# Patient Record
Sex: Female | Born: 1970 | Race: White | Hispanic: No | Marital: Single | Smoking: Former smoker
Health system: Southern US, Community
[De-identification: ages and names within clinical notes are randomized; demographics above are authoritative.]

## PROBLEM LIST (undated history)

## (undated) ENCOUNTER — Ambulatory Visit (HOSPITAL_COMMUNITY): Disposition: A | Discharge status: Other Acute Inpatient Hospital | Payer: BC Managed Care – PPO

## (undated) ENCOUNTER — Emergency Department (HOSPITAL_BASED_OUTPATIENT_CLINIC_OR_DEPARTMENT_OTHER): Admission: EM | Payer: BC Managed Care – PPO | Source: Home / Self Care

## (undated) DIAGNOSIS — Z8371 Family history of colonic polyps: Secondary | ICD-10-CM

## (undated) DIAGNOSIS — J45909 Unspecified asthma, uncomplicated: Secondary | ICD-10-CM

## (undated) DIAGNOSIS — M5020 Other cervical disc displacement, unspecified cervical region: Secondary | ICD-10-CM

## (undated) DIAGNOSIS — F418 Other specified anxiety disorders: Secondary | ICD-10-CM

## (undated) DIAGNOSIS — Z83719 Family history of colon polyps, unspecified: Secondary | ICD-10-CM

## (undated) DIAGNOSIS — N920 Excessive and frequent menstruation with regular cycle: Secondary | ICD-10-CM

## (undated) DIAGNOSIS — R87619 Unspecified abnormal cytological findings in specimens from cervix uteri: Secondary | ICD-10-CM

## (undated) DIAGNOSIS — J449 Chronic obstructive pulmonary disease, unspecified: Secondary | ICD-10-CM

## (undated) HISTORY — DX: Unspecified abnormal cytological findings in specimens from cervix uteri: R87.619

## (undated) HISTORY — DX: Family history of colonic polyps: Z83.71

## (undated) HISTORY — PX: COLPOSCOPY: SHX161

## (undated) HISTORY — DX: Other specified anxiety disorders: F41.8

## (undated) HISTORY — DX: Chronic obstructive pulmonary disease, unspecified: J44.9

## (undated) HISTORY — DX: Unspecified asthma, uncomplicated: J45.909

## (undated) HISTORY — DX: Excessive and frequent menstruation with regular cycle: N92.0

## (undated) HISTORY — DX: Other cervical disc displacement, unspecified cervical region: M50.20

## (undated) HISTORY — PX: GYNECOLOGIC CRYOSURGERY: SHX857

## (undated) HISTORY — DX: Family history of colon polyps, unspecified: Z83.719

## (undated) HISTORY — PX: MOLE REMOVAL: SHX2046

---

## 2000-07-22 ENCOUNTER — Emergency Department (HOSPITAL_COMMUNITY): Admission: EM | Admit: 2000-07-22 | Discharge: 2000-07-22 | Payer: Self-pay

## 2000-07-22 ENCOUNTER — Encounter: Payer: Self-pay | Admitting: Emergency Medicine

## 2002-08-13 ENCOUNTER — Other Ambulatory Visit: Admission: RE | Admit: 2002-08-13 | Discharge: 2002-08-13 | Payer: Self-pay | Admitting: Obstetrics and Gynecology

## 2008-08-14 ENCOUNTER — Encounter: Admission: RE | Admit: 2008-08-14 | Discharge: 2008-08-14 | Payer: Self-pay | Admitting: Family Medicine

## 2009-02-25 DIAGNOSIS — J449 Chronic obstructive pulmonary disease, unspecified: Secondary | ICD-10-CM | POA: Insufficient documentation

## 2009-02-25 DIAGNOSIS — J45909 Unspecified asthma, uncomplicated: Secondary | ICD-10-CM | POA: Insufficient documentation

## 2009-02-25 DIAGNOSIS — J4489 Other specified chronic obstructive pulmonary disease: Secondary | ICD-10-CM | POA: Insufficient documentation

## 2009-02-26 ENCOUNTER — Ambulatory Visit: Payer: Self-pay | Admitting: Pulmonary Disease

## 2009-02-26 ENCOUNTER — Encounter: Payer: Self-pay | Admitting: Pulmonary Disease

## 2009-02-26 DIAGNOSIS — J189 Pneumonia, unspecified organism: Secondary | ICD-10-CM | POA: Insufficient documentation

## 2009-02-26 DIAGNOSIS — F17200 Nicotine dependence, unspecified, uncomplicated: Secondary | ICD-10-CM | POA: Insufficient documentation

## 2009-02-26 DIAGNOSIS — J309 Allergic rhinitis, unspecified: Secondary | ICD-10-CM | POA: Insufficient documentation

## 2010-03-12 DIAGNOSIS — M5020 Other cervical disc displacement, unspecified cervical region: Secondary | ICD-10-CM

## 2010-03-12 HISTORY — DX: Other cervical disc displacement, unspecified cervical region: M50.20

## 2010-03-19 ENCOUNTER — Ambulatory Visit (HOSPITAL_COMMUNITY)
Admission: RE | Admit: 2010-03-19 | Discharge: 2010-03-19 | Payer: Self-pay | Source: Home / Self Care | Admitting: General Practice

## 2010-04-05 ENCOUNTER — Encounter
Admission: RE | Admit: 2010-04-05 | Discharge: 2010-06-09 | Payer: Self-pay | Source: Home / Self Care | Attending: Sports Medicine | Admitting: Sports Medicine

## 2010-05-21 ENCOUNTER — Ambulatory Visit (HOSPITAL_COMMUNITY)
Admission: RE | Admit: 2010-05-21 | Discharge: 2010-05-21 | Payer: Self-pay | Source: Home / Self Care | Attending: Sports Medicine | Admitting: Sports Medicine

## 2011-05-08 ENCOUNTER — Other Ambulatory Visit: Payer: Self-pay | Admitting: Obstetrics and Gynecology

## 2011-05-08 DIAGNOSIS — Z1231 Encounter for screening mammogram for malignant neoplasm of breast: Secondary | ICD-10-CM

## 2011-05-26 ENCOUNTER — Ambulatory Visit
Admission: RE | Admit: 2011-05-26 | Discharge: 2011-05-26 | Disposition: A | Discharge status: Home / Self Care | Payer: BC Managed Care – PPO | Source: Ambulatory Visit | Attending: Obstetrics and Gynecology | Admitting: Obstetrics and Gynecology

## 2011-05-26 DIAGNOSIS — Z1231 Encounter for screening mammogram for malignant neoplasm of breast: Secondary | ICD-10-CM

## 2011-12-12 ENCOUNTER — Ambulatory Visit: Payer: BC Managed Care – PPO | Attending: Sports Medicine

## 2011-12-12 DIAGNOSIS — M545 Low back pain, unspecified: Secondary | ICD-10-CM | POA: Insufficient documentation

## 2011-12-12 DIAGNOSIS — M2569 Stiffness of other specified joint, not elsewhere classified: Secondary | ICD-10-CM | POA: Insufficient documentation

## 2011-12-12 DIAGNOSIS — IMO0001 Reserved for inherently not codable concepts without codable children: Secondary | ICD-10-CM | POA: Insufficient documentation

## 2011-12-12 DIAGNOSIS — M542 Cervicalgia: Secondary | ICD-10-CM | POA: Insufficient documentation

## 2011-12-12 DIAGNOSIS — R5381 Other malaise: Secondary | ICD-10-CM | POA: Insufficient documentation

## 2011-12-13 ENCOUNTER — Ambulatory Visit: Payer: BC Managed Care – PPO | Admitting: Physical Therapy

## 2011-12-19 ENCOUNTER — Ambulatory Visit: Payer: BC Managed Care – PPO

## 2011-12-22 ENCOUNTER — Ambulatory Visit: Payer: BC Managed Care – PPO

## 2011-12-25 ENCOUNTER — Ambulatory Visit: Payer: BC Managed Care – PPO | Admitting: Physical Therapy

## 2011-12-29 ENCOUNTER — Ambulatory Visit: Payer: BC Managed Care – PPO

## 2012-01-05 ENCOUNTER — Ambulatory Visit: Payer: BC Managed Care – PPO

## 2012-01-09 ENCOUNTER — Ambulatory Visit: Payer: BC Managed Care – PPO | Admitting: Physical Therapy

## 2012-01-12 ENCOUNTER — Ambulatory Visit: Payer: BC Managed Care – PPO | Attending: Sports Medicine | Admitting: Physical Therapy

## 2012-01-12 DIAGNOSIS — M545 Low back pain, unspecified: Secondary | ICD-10-CM | POA: Insufficient documentation

## 2012-01-12 DIAGNOSIS — R5381 Other malaise: Secondary | ICD-10-CM | POA: Insufficient documentation

## 2012-01-12 DIAGNOSIS — M542 Cervicalgia: Secondary | ICD-10-CM | POA: Insufficient documentation

## 2012-01-12 DIAGNOSIS — IMO0001 Reserved for inherently not codable concepts without codable children: Secondary | ICD-10-CM | POA: Insufficient documentation

## 2012-01-12 DIAGNOSIS — M2569 Stiffness of other specified joint, not elsewhere classified: Secondary | ICD-10-CM | POA: Insufficient documentation

## 2012-04-15 ENCOUNTER — Other Ambulatory Visit: Payer: Self-pay | Admitting: Obstetrics and Gynecology

## 2012-04-15 ENCOUNTER — Other Ambulatory Visit: Payer: Self-pay | Admitting: Nurse Practitioner

## 2012-04-15 DIAGNOSIS — Z1231 Encounter for screening mammogram for malignant neoplasm of breast: Secondary | ICD-10-CM

## 2012-05-27 ENCOUNTER — Ambulatory Visit
Admission: RE | Admit: 2012-05-27 | Discharge: 2012-05-27 | Disposition: A | Discharge status: Home / Self Care | Payer: BC Managed Care – PPO | Source: Ambulatory Visit | Attending: Nurse Practitioner | Admitting: Nurse Practitioner

## 2012-05-27 DIAGNOSIS — Z1231 Encounter for screening mammogram for malignant neoplasm of breast: Secondary | ICD-10-CM

## 2013-03-28 ENCOUNTER — Other Ambulatory Visit: Payer: Self-pay | Admitting: Nurse Practitioner

## 2013-03-28 NOTE — Telephone Encounter (Signed)
Rx was given 04/06/11 for Clindamycin 1% pledgets use as directed 1-2 x day #60 x 2 refills. Please advise

## 2013-04-10 ENCOUNTER — Encounter: Payer: Self-pay | Admitting: Obstetrics and Gynecology

## 2013-04-10 ENCOUNTER — Encounter: Payer: Self-pay | Admitting: Nurse Practitioner

## 2013-04-10 ENCOUNTER — Ambulatory Visit (INDEPENDENT_AMBULATORY_CARE_PROVIDER_SITE_OTHER): Payer: BC Managed Care – PPO | Admitting: Nurse Practitioner

## 2013-04-10 VITALS — BP 100/60 | HR 80 | Resp 16 | Ht 65.75 in | Wt 128.0 lb

## 2013-04-10 DIAGNOSIS — Z01419 Encounter for gynecological examination (general) (routine) without abnormal findings: Secondary | ICD-10-CM

## 2013-04-10 DIAGNOSIS — Z113 Encounter for screening for infections with a predominantly sexual mode of transmission: Secondary | ICD-10-CM

## 2013-04-10 DIAGNOSIS — Z0000 Encounter for general adult medical examination without abnormal findings: Secondary | ICD-10-CM

## 2013-04-10 LAB — POCT URINALYSIS DIPSTICK
Blood, UA: NEGATIVE
Ketones, UA: NEGATIVE
Protein, UA: NEGATIVE
Urobilinogen, UA: NEGATIVE
pH, UA: 6.5

## 2013-04-10 LAB — HEMOGLOBIN, FINGERSTICK: Hemoglobin, fingerstick: 14.2 g/dL (ref 12.0–16.0)
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
## 2013-04-10 NOTE — Progress Notes (Signed)
Encounter reviewed by Dr. Brook Silva.  

## 2013-04-10 NOTE — Patient Instructions (Signed)

## 2013-04-11 LAB — STD PANEL

## 2013-06-13 ENCOUNTER — Other Ambulatory Visit: Payer: Self-pay

## 2013-06-13 DIAGNOSIS — Z1231 Encounter for screening mammogram for malignant neoplasm of breast: Secondary | ICD-10-CM

## 2013-06-26 ENCOUNTER — Other Ambulatory Visit: Payer: Self-pay | Admitting: Family Medicine

## 2013-06-26 DIAGNOSIS — R1011 Right upper quadrant pain: Secondary | ICD-10-CM

## 2013-06-30 ENCOUNTER — Ambulatory Visit
Admission: RE | Admit: 2013-06-30 | Discharge: 2013-06-30 | Disposition: A | Discharge status: Home / Self Care | Payer: BC Managed Care – PPO | Source: Ambulatory Visit | Attending: Family Medicine | Admitting: Family Medicine

## 2013-06-30 DIAGNOSIS — R1011 Right upper quadrant pain: Secondary | ICD-10-CM

## 2013-07-03 ENCOUNTER — Ambulatory Visit
Admission: RE | Admit: 2013-07-03 | Discharge: 2013-07-03 | Disposition: A | Discharge status: Home / Self Care | Payer: BC Managed Care – PPO | Source: Ambulatory Visit

## 2013-07-03 DIAGNOSIS — Z1231 Encounter for screening mammogram for malignant neoplasm of breast: Secondary | ICD-10-CM

## 2014-02-19 ENCOUNTER — Encounter: Payer: Self-pay | Admitting: Nurse Practitioner

## 2014-04-13 ENCOUNTER — Ambulatory Visit
Admission: RE | Admit: 2014-04-13 | Discharge: 2014-04-13 | Disposition: A | Discharge status: Home / Self Care | Payer: BC Managed Care – PPO | Source: Ambulatory Visit | Attending: Allergy | Admitting: Allergy

## 2014-04-13 ENCOUNTER — Other Ambulatory Visit: Payer: Self-pay | Admitting: Allergy

## 2014-04-13 DIAGNOSIS — J4530 Mild persistent asthma, uncomplicated: Secondary | ICD-10-CM

## 2014-04-16 ENCOUNTER — Ambulatory Visit: Payer: BC Managed Care – PPO | Admitting: Nurse Practitioner

## 2014-04-30 ENCOUNTER — Ambulatory Visit: Payer: BC Managed Care – PPO | Admitting: Nurse Practitioner

## 2014-06-18 ENCOUNTER — Encounter: Payer: Self-pay | Admitting: Nurse Practitioner

## 2014-06-18 ENCOUNTER — Ambulatory Visit (INDEPENDENT_AMBULATORY_CARE_PROVIDER_SITE_OTHER): Payer: BC Managed Care – PPO | Admitting: Nurse Practitioner

## 2014-06-18 VITALS — BP 122/76 | HR 84 | Ht 66.0 in | Wt 137.0 lb

## 2014-06-18 DIAGNOSIS — N939 Abnormal uterine and vaginal bleeding, unspecified: Secondary | ICD-10-CM

## 2014-06-18 DIAGNOSIS — Z01419 Encounter for gynecological examination (general) (routine) without abnormal findings: Secondary | ICD-10-CM

## 2014-06-18 DIAGNOSIS — N921 Excessive and frequent menstruation with irregular cycle: Secondary | ICD-10-CM

## 2014-06-18 DIAGNOSIS — Z0000 Encounter for general adult medical examination without abnormal findings: Secondary | ICD-10-CM

## 2014-06-18 LAB — POCT URINALYSIS DIPSTICK
Bilirubin, UA: NEGATIVE
Blood, UA: NEGATIVE
Glucose, UA: NEGATIVE
Ketones, UA: NEGATIVE
Leukocytes, UA: NEGATIVE
NITRITE UA: NEGATIVE
Protein, UA: NEGATIVE
UROBILINOGEN UA: NEGATIVE
pH, UA: 5

## 2014-06-18 LAB — POCT URINE PREGNANCY: PREG TEST UR: NEGATIVE

## 2014-06-18 NOTE — Patient Instructions (Signed)
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
## 2014-06-19 LAB — HEMOGLOBIN, FINGERSTICK: HEMOGLOBIN, FINGERSTICK: 13.5 g/dL (ref 12.0–16.0)

## 2014-06-21 NOTE — Progress Notes (Signed)
Encounter reviewed by Dr. Conley SimmondsBrook Silva.  If metrorrhagia persists, I recommend TSH, sonohysterogram, and possible EMB.

## 2014-06-22 LAB — IPS PAP TEST WITH HPV

## 2014-06-26 ENCOUNTER — Telehealth: Payer: Self-pay | Admitting: Emergency Medicine

## 2014-06-26 DIAGNOSIS — R87610 Atypical squamous cells of undetermined significance on cytologic smear of cervix (ASC-US): Secondary | ICD-10-CM

## 2014-06-26 DIAGNOSIS — R87810 Cervical high risk human papillomavirus (HPV) DNA test positive: Principal | ICD-10-CM

## 2014-06-26 NOTE — Telephone Encounter (Signed)
Message left to return call to [facility] at [phone].   Patient using condoms only for birth control.  Colposcopy order pended for patient notification.

## 2014-06-26 NOTE — Telephone Encounter (Signed)
-----   Message from Lauro FranklinPatricia Rolen-Grubb, FNP sent at 06/22/2014  3:01 PM EST ----- Let patient know that pap shows ASCUS with HR HPV.  Will need Colpo Biopsy with Dr. Edward JollySilva.  (Of note when she comes in, have already discussed other options of AUB and maybe further discussion about repeat PUS and biopsy.)

## 2014-06-26 NOTE — Telephone Encounter (Signed)
Katie Rowland notified of message from Katie FranklinPatricia Rolen-Grubb, FNP.  She is agreeable to scheduling colposcopy. Advised of pap smear and HPV testing. Questions answered. Brief description of procedure given to Katie Rowland.    Discussed menses and need to not have any bleeding on day of appointment. Katie Rowland uses condoms only for contraception. Advised Katie Rowland to call back on first day of cycle to schedule procedure within first 12 days of cycle.   Katie Rowland states LMP approximately 06/06/15.   Katie Rowland will call back to schedule with first day of cycle.   Colposcopy ordered for  insurance pre certification.   Routing to provider for final review. Katie Rowland agreeable to disposition. Will close encounter   cc Dr. Burt EkSilva     Sabrina Rowland.

## 2014-06-30 ENCOUNTER — Telehealth: Payer: Self-pay | Admitting: Nurse Practitioner

## 2014-06-30 NOTE — Telephone Encounter (Signed)
Spoke with patient. Patient of Katie Franklinatricia Rolen-Grubb, FNP. Patient states she started her cycle on 06/26/14. States cycle can be 8-9 days long so hesitant to schedule any earlier than 07/06/14. Patient uses condoms only for birth control method.  Patient scheduled for colposcopy with Dr. Hyacinth Rowland for 07/06/14. Instructions given. Motrin 800 mg po x 1 one hour before appointment with food. Make sure to eat a meal before appointment and drink plenty of fluids. Patient verbalized understanding and will call to reschedule if will be on menses or has any concerns regarding pregnancy. Advised will need to cancel within 24 hours or will have $150.00 late cancellation fee placed to account. Patient agreeable and verbalized understanding of all instructions.   Routing to provider for final review. Patient agreeable to disposition. Will close encounter.

## 2014-06-30 NOTE — Telephone Encounter (Signed)
Patient calling to report she started her menstrual cycle on 06/26/14. She was told to call with this information to schedule a colposcopy.   Patient requests the nurse schedule her appointment and leave the details on her voicemail due to meetings at work. She is aware she will have to adjust her schedule at work for an appointment.

## 2014-07-03 ENCOUNTER — Ambulatory Visit: Payer: BC Managed Care – PPO | Admitting: Obstetrics & Gynecology

## 2014-07-06 ENCOUNTER — Encounter: Payer: Self-pay | Admitting: Obstetrics & Gynecology

## 2014-07-06 ENCOUNTER — Ambulatory Visit (INDEPENDENT_AMBULATORY_CARE_PROVIDER_SITE_OTHER): Payer: BC Managed Care – PPO | Admitting: Obstetrics & Gynecology

## 2014-07-06 DIAGNOSIS — R87810 Cervical high risk human papillomavirus (HPV) DNA test positive: Secondary | ICD-10-CM

## 2014-07-06 DIAGNOSIS — R87610 Atypical squamous cells of undetermined significance on cytologic smear of cervix (ASC-US): Secondary | ICD-10-CM

## 2014-07-06 NOTE — Progress Notes (Signed)
Subjective:     Patient ID: Katie PiggMelissa A Poplin, female   DOB: [birth year], 44 y.o.   MRN: [id number]  HPI 44 yo G1A1 SWF here for colposcopy due to ASCUS pap with + HR HPV obtained at [facility] 06/18/14.  Pt has prior normal pap and neg HR HPV form 2013.  Pt also has remote hx of cryo to cervix in her 20's but has not had any issues since then.    Patient has been counseled about results and procedure.  Risks and benefits have bene reviewed including immediate and/or delayed bleeding, infection, cervical scaring from procedure, possibility of needing additional follow up as well as treatment.  rare risks of missing a lesion discussed as well.  All questions answered.  Pt ready to proceed.  Patient's last menstrual period was 06/26/2014.          Sexually active: Yes.    The current method of family planning is faithful condom use.     Review of Systems     Objective:   Physical Exam  Genitourinary:     BP 128/72 mmHg  Pulse 72  Resp 16  Wt 135 lb 9.6 oz (61.508 kg)   General appearance: alert, cooperative and appears stated age Head: Normocephalic, without obvious abnormality, atraumatic Neurologic: Grossly normal  Pelvic: External genitalia:  no lesions              Urethra:  normal appearing urethra with no masses, tenderness or lesions              Bartholins and Skenes: normal                 Vagina: normal appearing vagina with normal color and discharge, no lesions              Cervix: no lesions              Pap taken: No.  Speculum placed.  3% acetic acid applied to cervix for >45 seconds.  Cervix visualized with both 7.5X and 15X magnification.  Green filter also used.  Lugols solution was not used.  Findings:  Small area of AWE at 7-8 o'clock.  Biopsy:  8 o'clock.  ECC:  was performed.  there was some bleeding noted with the ECC that was more than is typically seen.  Monsel's was needed.  Excellent hemostasis was present.  Pt tolerated procedure well and all instruments were  removed.  Findings noted above on picture of cervix.    Assessment:     ASCUS pap with +HR HPV     Plan:     Biopsy and ECC pending.  If CIN 1 or <, pt will have repeat pap and HR HPV 1 year.  Results will be called to pt.

## 2014-07-08 LAB — IPS OTHER TISSUE BIOPSY

## 2014-07-10 ENCOUNTER — Telehealth: Payer: Self-pay | Admitting: Obstetrics and Gynecology

## 2014-07-10 NOTE — Telephone Encounter (Signed)
Dr.Miller, please review and advise results from 07/06/2014.

## 2014-07-10 NOTE — Telephone Encounter (Signed)
Patient calling to see if her results are in °

## 2014-07-14 ENCOUNTER — Telehealth: Payer: Self-pay

## 2014-07-14 NOTE — Telephone Encounter (Signed)
-----   Message from Annamaria BootsMary Suzanne Miller, MD sent at 07/14/2014  7:30 AM EST ----- Pt notified personally of results.  Needs Pap six months with ECC.  Please call and schedule.  06 recall.

## 2014-07-14 NOTE — Telephone Encounter (Signed)
2/2 lmtcb//kn

## 2014-07-14 NOTE — Telephone Encounter (Signed)
Spoke with pt personally.  Triage note was generated to schedule 6 month f/u pap smear with ECC.  Encounter closed.

## 2014-08-04 NOTE — Telephone Encounter (Signed)
Spoke with patient-6 month pap/ECC appointment scheduled for 12/04/14. Patient aware may want to take Ibuprofen prior to procedure. 06 recall entered in epic for 6/16. Will send message to Martie LeeSabrina in case this needs a pre-cert.//kn

## 2014-12-04 ENCOUNTER — Ambulatory Visit (INDEPENDENT_AMBULATORY_CARE_PROVIDER_SITE_OTHER): Payer: BC Managed Care – PPO | Admitting: Obstetrics & Gynecology

## 2014-12-04 ENCOUNTER — Encounter: Payer: Self-pay | Admitting: Obstetrics & Gynecology

## 2014-12-04 VITALS — BP 124/76 | HR 72 | Resp 16 | Ht 66.0 in | Wt 136.0 lb

## 2014-12-04 DIAGNOSIS — IMO0002 Reserved for concepts with insufficient information to code with codable children: Secondary | ICD-10-CM

## 2014-12-04 DIAGNOSIS — N870 Mild cervical dysplasia: Secondary | ICD-10-CM | POA: Diagnosis not present

## 2014-12-04 DIAGNOSIS — R896 Abnormal cytological findings in specimens from other organs, systems and tissues: Secondary | ICD-10-CM

## 2014-12-04 NOTE — Progress Notes (Signed)
Subjective:     Patient ID: Katie Rowland, female   DOB: 1971/05/20, 44 y.o.   MRN: [id number]  HPI 44 yo G1p1 SWF here for repeat Pap smear and ECC due to hx of ASCUS pap with HR HPV obtained 06/28/14.  Pt did have colposcopy with biopsy and ECC on 07/06/14.  Pathology showed: CERVIX, 8:00, BIOPSIES:  -BENIGN ECTOCERVICAL-TYPE SQUAMOUS MUCOSA WITH MILD KOILOCYTOTIC-TYPE ATYPIA, CONSISTENT WITH HPV EFFECT (LSIL).  -NO SQUAMOCOLUMNAR TRANSITIONAL ZONE PRESENT.  (B) ENDOCERVIX, CURETTINGS:  -SINGLE MINUTE FRAGMENT OF ATYPICAL SQUAMOUS EPITHELIUM WITH SUPERIMPOSED ACUTE INFLAMMATION.  -FEW ADDITIONAL MINUTE PORTIONS OF UNREMARKABLE BENIGN ENDOCERVICAL EPITHELIUM.  -IMMUNOSTAIN FOR P16 (PERFORMED ON BLOCK B1 WITH ADEQUATE CONTROL): NONCONTRIBUTORY DUE TO EXHAUSTION OF CELL GROUP OF INTEREST.   Due to the ECC findings, recommended pt having ECC with pap today.    Pt reports death of two family members and her dog in last couple of months.  This has caused a lot of stress and this month her cycle lasted about 12 days.  It started on time.  There was no heavy bleeding but it just dragged on for days.  Pt reports this has happened in the past due to stressors .  She does not want any treatment at this time.  Just wanted to mention it.  Pt reports hx of PUS in past (nothing in EPIC) showing fibroids   Review of Systems  All other systems reviewed and are negative.      Objective:   Physical Exam  Constitutional: She is oriented to person, place, and time.  HENT:  Head: Normocephalic and atraumatic.  Genitourinary: Vagina normal. There is no rash, tenderness, lesion or injury on the right labia. There is no rash, tenderness, lesion or injury on the left labia. Cervix exhibits no motion tenderness, no discharge and no friability. Right adnexum displays no mass and no tenderness. Left adnexum displays no mass and no tenderness.  Pap obtained.  Following this kevorkian curette used to obtain ECC.  Pt  did have some cramping but tolerated procedure well.  Lymphadenopathy:       Right: No inguinal adenopathy present.       Left: No inguinal adenopathy present.  Neurological: She is alert and oriented to person, place, and time.  Skin: Skin is warm and dry.  Psychiatric: She has a normal mood and affect.       Assessment:     ASCUS pap with + HRHPV Inconclusive ECC with colposcopy 1/16 DUB H/O fibroids per her report     Plan:     Pap and ECC obtained.    Pt aware to let me know if have another month with bleeding like this one.  Would check PUS, TSH, and possible endometrial biopsy.

## 2014-12-07 LAB — IPS PAP SMEAR ONLY

## 2014-12-08 LAB — IPS OTHER TISSUE BIOPSY

## 2014-12-10 ENCOUNTER — Telehealth: Payer: Self-pay

## 2014-12-10 NOTE — Telephone Encounter (Signed)
Left message to call Nava Song at [phone]. 

## 2014-12-10 NOTE — Telephone Encounter (Signed)
-----   Message from Jerene BearsMary S Miller, MD sent at 12/09/2014  3:37 PM EDT ----- Inform pap was normal and ECC negative.  Out of current recall.  Needs AEX and reepat pap with HR HPV 6 months.  Should have 08 recall already entered but if not, please place.  Thanks.

## 2014-12-10 NOTE — Telephone Encounter (Signed)
Spoke with patient. Advised of results as seen below from Dr.Miller. Patient is agreeable and verbalizes understanding. 08 recall entered. Is scheduled for aex on 06/22/2015 at 3:30pm with Lauro FranklinPatricia Rolen-Grubb, FNP.  Routing to provider for final review. Patient agreeable to disposition. Will close encounter.

## 2014-12-10 NOTE — Telephone Encounter (Signed)
Return call to Kaitlyn. °

## 2015-05-14 ENCOUNTER — Other Ambulatory Visit: Payer: Self-pay

## 2015-05-14 DIAGNOSIS — Z1231 Encounter for screening mammogram for malignant neoplasm of breast: Secondary | ICD-10-CM

## 2015-06-17 ENCOUNTER — Ambulatory Visit
Admission: RE | Admit: 2015-06-17 | Discharge: 2015-06-17 | Disposition: A | Discharge status: Home / Self Care | Payer: BC Managed Care – PPO | Source: Ambulatory Visit

## 2015-06-17 DIAGNOSIS — Z1231 Encounter for screening mammogram for malignant neoplasm of breast: Secondary | ICD-10-CM

## 2015-06-22 ENCOUNTER — Ambulatory Visit: Payer: BC Managed Care – PPO | Admitting: Nurse Practitioner

## 2015-08-27 ENCOUNTER — Ambulatory Visit (INDEPENDENT_AMBULATORY_CARE_PROVIDER_SITE_OTHER): Payer: BC Managed Care – PPO | Admitting: Nurse Practitioner

## 2015-08-27 ENCOUNTER — Encounter: Payer: Self-pay | Admitting: Nurse Practitioner

## 2015-08-27 VITALS — BP 120/70 | HR 70 | Resp 16 | Ht 65.75 in | Wt 123.0 lb

## 2015-08-27 DIAGNOSIS — N912 Amenorrhea, unspecified: Secondary | ICD-10-CM | POA: Diagnosis not present

## 2015-08-27 DIAGNOSIS — R87612 Low grade squamous intraepithelial lesion on cytologic smear of cervix (LGSIL): Secondary | ICD-10-CM

## 2015-08-27 DIAGNOSIS — Z0000 Encounter for general adult medical examination without abnormal findings: Secondary | ICD-10-CM

## 2015-08-27 DIAGNOSIS — Z01419 Encounter for gynecological examination (general) (routine) without abnormal findings: Secondary | ICD-10-CM

## 2015-08-27 LAB — POCT URINALYSIS DIPSTICK
Bilirubin, UA: NEGATIVE
Blood, UA: NEGATIVE
GLUCOSE UA: NEGATIVE
KETONES UA: NEGATIVE
Leukocytes, UA: NEGATIVE
Nitrite, UA: NEGATIVE
Protein, UA: NEGATIVE
Urobilinogen, UA: NEGATIVE
pH, UA: 5

## 2015-08-27 LAB — POCT URINE PREGNANCY: PREG TEST UR: NEGATIVE

## 2015-08-27 NOTE — Patient Instructions (Signed)
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
## 2015-08-29 NOTE — Progress Notes (Signed)
Encounter reviewed by Dr. Brook Amundson C. Silva.  

## 2015-09-02 LAB — IPS PAP TEST WITH HPV

## 2015-09-03 LAB — HEMOGLOBIN, FINGERSTICK: Hemoglobin, fingerstick: 13.1 g/dL (ref 12.0–16.0)

## 2015-09-06 ENCOUNTER — Telehealth: Payer: Self-pay | Admitting: *Deleted

## 2015-09-06 MED ORDER — METRONIDAZOLE 0.75 % VA GEL: 1.0000 | Freq: Every day | VAGINAL | Status: AC

## 2015-09-06 NOTE — Telephone Encounter (Signed)
-----   Message from Verner Choleborah S Leonard, CNM sent at 09/03/2015 12:49 PM EDT ----- Pap smear negative, no endos, HPVHR not detected 02 BV present, patient will need Metrogel if symptomatic, please advise

## 2015-09-06 NOTE — Addendum Note (Signed)
Addended by: Francee PiccoloPHILLIPS, STEPHANIE C on: 09/06/2015 12:04 PM   Modules accepted: Orders, SmartSet

## 2015-09-06 NOTE — Telephone Encounter (Signed)
I have attempted to contact this patient by phone with the following results: left message to return call to [place] at [phone] on answering machine (mobile per [facility]).  No personal information given.  [phone] (Mobile)

## 2015-09-27 NOTE — Telephone Encounter (Signed)
Pt notified in result note 09/06/15.  Closing encounter.  

## 2016-07-05 ENCOUNTER — Other Ambulatory Visit: Payer: Self-pay | Admitting: Family Medicine

## 2016-07-05 DIAGNOSIS — Z1231 Encounter for screening mammogram for malignant neoplasm of breast: Secondary | ICD-10-CM

## 2016-08-03 ENCOUNTER — Ambulatory Visit
Admission: RE | Admit: 2016-08-03 | Discharge: 2016-08-03 | Disposition: A | Discharge status: Home / Self Care | Payer: BC Managed Care – PPO | Source: Ambulatory Visit | Attending: Family Medicine | Admitting: Family Medicine

## 2016-08-03 DIAGNOSIS — Z1231 Encounter for screening mammogram for malignant neoplasm of breast: Secondary | ICD-10-CM

## 2016-08-29 ENCOUNTER — Encounter: Payer: Self-pay | Admitting: Nurse Practitioner

## 2016-08-29 ENCOUNTER — Ambulatory Visit (INDEPENDENT_AMBULATORY_CARE_PROVIDER_SITE_OTHER): Payer: BC Managed Care – PPO | Admitting: Nurse Practitioner

## 2016-08-29 VITALS — BP 98/60 | HR 68 | Resp 16 | Ht 65.75 in | Wt 124.0 lb

## 2016-08-29 DIAGNOSIS — Z01419 Encounter for gynecological examination (general) (routine) without abnormal findings: Secondary | ICD-10-CM | POA: Diagnosis not present

## 2016-08-29 DIAGNOSIS — Z0000 Encounter for general adult medical examination without abnormal findings: Secondary | ICD-10-CM | POA: Diagnosis not present

## 2016-08-29 DIAGNOSIS — R87612 Low grade squamous intraepithelial lesion on cytologic smear of cervix (LGSIL): Secondary | ICD-10-CM | POA: Diagnosis not present
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
## 2016-08-29 NOTE — Patient Instructions (Signed)

## 2016-08-31 LAB — IPS PAP TEST WITH HPV

## 2016-08-31 NOTE — Progress Notes (Signed)
Encounter reviewed by Dr. Deeann Servidio Amundson C. Silva.  

## 2017-01-10 ENCOUNTER — Telehealth: Payer: Self-pay | Admitting: Obstetrics and Gynecology

## 2017-01-10 NOTE — Telephone Encounter (Signed)
Left patient a message to call back to reschedule a future appointment that was cancelled by the provider. °

## 2017-06-15 DIAGNOSIS — M545 Low back pain, unspecified: Secondary | ICD-10-CM | POA: Insufficient documentation

## 2017-06-15 DIAGNOSIS — M542 Cervicalgia: Secondary | ICD-10-CM | POA: Insufficient documentation

## 2017-07-17 ENCOUNTER — Other Ambulatory Visit: Payer: Self-pay | Admitting: Family Medicine

## 2017-07-17 DIAGNOSIS — Z1231 Encounter for screening mammogram for malignant neoplasm of breast: Secondary | ICD-10-CM

## 2017-08-07 ENCOUNTER — Ambulatory Visit
Admission: RE | Admit: 2017-08-07 | Discharge: 2017-08-07 | Disposition: A | Discharge status: Home / Self Care | Payer: BC Managed Care – PPO | Source: Ambulatory Visit | Attending: Family Medicine | Admitting: Family Medicine

## 2017-08-07 DIAGNOSIS — Z1231 Encounter for screening mammogram for malignant neoplasm of breast: Secondary | ICD-10-CM

## 2017-08-08 ENCOUNTER — Other Ambulatory Visit: Payer: Self-pay | Admitting: Family Medicine

## 2017-08-08 DIAGNOSIS — R928 Other abnormal and inconclusive findings on diagnostic imaging of breast: Secondary | ICD-10-CM

## 2017-08-14 ENCOUNTER — Ambulatory Visit
Admission: RE | Admit: 2017-08-14 | Discharge: 2017-08-14 | Disposition: A | Discharge status: Home / Self Care | Payer: BC Managed Care – PPO | Source: Ambulatory Visit | Attending: Family Medicine | Admitting: Family Medicine

## 2017-08-14 DIAGNOSIS — R928 Other abnormal and inconclusive findings on diagnostic imaging of breast: Secondary | ICD-10-CM

## 2017-08-31 ENCOUNTER — Ambulatory Visit: Payer: BC Managed Care – PPO | Admitting: Nurse Practitioner

## 2017-08-31 DIAGNOSIS — F419 Anxiety disorder, unspecified: Secondary | ICD-10-CM | POA: Insufficient documentation

## 2017-08-31 DIAGNOSIS — F32A Depression, unspecified: Secondary | ICD-10-CM | POA: Insufficient documentation

## 2017-08-31 DIAGNOSIS — J45909 Unspecified asthma, uncomplicated: Secondary | ICD-10-CM | POA: Insufficient documentation

## 2017-08-31 DIAGNOSIS — K635 Polyp of colon: Secondary | ICD-10-CM | POA: Insufficient documentation

## 2017-09-03 ENCOUNTER — Ambulatory Visit: Payer: BC Managed Care – PPO | Admitting: Obstetrics and Gynecology

## 2017-09-27 ENCOUNTER — Other Ambulatory Visit: Payer: Self-pay

## 2017-09-27 ENCOUNTER — Ambulatory Visit: Payer: BC Managed Care – PPO | Admitting: Obstetrics and Gynecology

## 2017-09-27 ENCOUNTER — Other Ambulatory Visit (HOSPITAL_COMMUNITY)
Admission: RE | Admit: 2017-09-27 | Discharge: 2017-09-27 | Disposition: A | Discharge status: Home / Self Care | Payer: BC Managed Care – PPO | Source: Ambulatory Visit | Attending: Obstetrics and Gynecology | Admitting: Obstetrics and Gynecology

## 2017-09-27 ENCOUNTER — Encounter: Payer: Self-pay | Admitting: Obstetrics and Gynecology

## 2017-09-27 VITALS — BP 102/60 | HR 72 | Resp 14 | Ht 66.0 in | Wt 129.0 lb

## 2017-09-27 DIAGNOSIS — Z124 Encounter for screening for malignant neoplasm of cervix: Secondary | ICD-10-CM | POA: Insufficient documentation

## 2017-09-27 DIAGNOSIS — N920 Excessive and frequent menstruation with regular cycle: Secondary | ICD-10-CM

## 2017-09-27 DIAGNOSIS — Z01419 Encounter for gynecological examination (general) (routine) without abnormal findings: Secondary | ICD-10-CM
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
## 2017-09-27 NOTE — Patient Instructions (Signed)
EXERCISE AND DIET:  We recommended that you start or continue a regular exercise program for good health. Regular exercise means any activity that makes your heart beat faster and makes you sweat.  We recommend exercising at least 30 minutes per day at least 3 days a week, preferably 4 or 5.  We also recommend a diet low in fat and sugar.  Inactivity, poor dietary choices and obesity can cause diabetes, heart attack, stroke, and kidney damage, among others.    ALCOHOL AND SMOKING:  Women should limit their alcohol intake to no more than 7 drinks/beers/glasses of wine (combined, not each!) per week. Moderation of alcohol intake to this level decreases your risk of breast cancer and liver damage. And of course, no recreational drugs are part of a healthy lifestyle.  And absolutely no smoking or even second hand smoke. Most people know smoking can cause heart and lung diseases, but did you know it also contributes to weakening of your bones? Aging of your skin?  Yellowing of your teeth and nails?  CALCIUM AND VITAMIN D:  Adequate intake of calcium and Vitamin D are recommended.  The recommendations for exact amounts of these supplements seem to change often, but generally speaking 600 mg of calcium (either carbonate or citrate) and 800 units of Vitamin D per day seems prudent. Certain women may benefit from higher intake of Vitamin D.  If you are among these women, your doctor will have told you during your visit.    PAP SMEARS:  Pap smears, to check for cervical cancer or precancers,  have traditionally been done yearly, although recent scientific advances have shown that most women can have pap smears less often.  However, every woman still should have a physical exam from her gynecologist every year. It will include a breast check, inspection of the vulva and vagina to check for abnormal growths or skin changes, a visual exam of the cervix, and then an exam to evaluate the size and shape of the uterus and  ovaries.  And after 48 years of age, a rectal exam is indicated to check for rectal cancers. We will also provide age appropriate advice regarding health maintenance, like when you should have certain vaccines, screening for sexually transmitted diseases, bone density testing, colonoscopy, mammograms, etc.   MAMMOGRAMS:  All women over 40 years old should have a yearly mammogram. Many facilities now offer a "3D" mammogram, which may cost around $50 extra out of pocket. If possible,  we recommend you accept the option to have the 3D mammogram performed.  It both reduces the number of women who will be called back for extra views which then turn out to be normal, and it is better than the routine mammogram at detecting truly abnormal areas.    COLONOSCOPY:  Colonoscopy to screen for colon cancer is recommended for all women at age 50.  We know, you hate the idea of the prep.  We agree, BUT, having colon cancer and not knowing it is worse!!  Colon cancer so often starts as a polyp that can be seen and removed at colonscopy, which can quite literally save your life!  And if your first colonoscopy is normal and you have no family history of colon cancer, most women don't have to have it again for 10 years.  Once every ten years, you can do something that may end up saving your life, right?  We will be happy to help you get it scheduled when you are ready.    Be sure to check your insurance coverage so you understand how much it will cost.  It may be covered as a preventative service at no cost, but you should check your particular policy.      Breast Self-Awareness Breast self-awareness means being familiar with how your breasts look and feel. It involves checking your breasts regularly and reporting any changes to your health care provider. Practicing breast self-awareness is important. A change in your breasts can be a sign of a serious medical problem. Being familiar with how your breasts look and feel allows  you to find any problems early, when treatment is more likely to be successful. All women should practice breast self-awareness, including women who have had breast implants. How to do a breast self-exam One way to learn what is normal for your breasts and whether your breasts are changing is to do a breast self-exam. To do a breast self-exam: Look for Changes  1. Remove all the clothing above your waist. 2. Stand in front of a mirror in a room with good lighting. 3. Put your hands on your hips. 4. Push your hands firmly downward. 5. Compare your breasts in the mirror. Look for differences between them (asymmetry), such as: ? Differences in shape. ? Differences in size. ? Puckers, dips, and bumps in one breast and not the other. 6. Look at each breast for changes in your skin, such as: ? Redness. ? Scaly areas. 7. Look for changes in your nipples, such as: ? Discharge. ? Bleeding. ? Dimpling. ? Redness. ? A change in position. Feel for Changes  Carefully feel your breasts for lumps and changes. It is best to do this while lying on your back on the floor and again while sitting or standing in the shower or tub with soapy water on your skin. Feel each breast in the following way:  Place the arm on the side of the breast you are examining above your head.  Feel your breast with the other hand.  Start in the nipple area and make  inch (2 cm) overlapping circles to feel your breast. Use the pads of your three middle fingers to do this. Apply light pressure, then medium pressure, then firm pressure. The light pressure will allow you to feel the tissue closest to the skin. The medium pressure will allow you to feel the tissue that is a little deeper. The firm pressure will allow you to feel the tissue close to the ribs.  Continue the overlapping circles, moving downward over the breast until you feel your ribs below your breast.  Move one finger-width toward the center of the body.  Continue to use the  inch (2 cm) overlapping circles to feel your breast as you move slowly up toward your collarbone.  Continue the up and down exam using all three pressures until you reach your armpit.  Write Down What You Find  Write down what is normal for each breast and any changes that you find. Keep a written record with breast changes or normal findings for each breast. By writing this information down, you do not need to depend only on memory for size, tenderness, or location. Write down where you are in your menstrual cycle, if you are still menstruating. If you are having trouble noticing differences in your breasts, do not get discouraged. With time you will become more familiar with the variations in your breasts and more comfortable with the exam. How often should I examine my breasts? Examine   your breasts every month. If you are breastfeeding, the best time to examine your breasts is after a feeding or after using a breast pump. If you menstruate, the best time to examine your breasts is 5-7 days after your period is over. During your period, your breasts are lumpier, and it may be more difficult to notice changes. When should I see my health care provider? See your health care provider if you notice:  A change in shape or size of your breasts or nipples.  A change in the skin of your breast or nipples, such as a reddened or scaly area.  Unusual discharge from your nipples.  A lump or thick area that was not there before.  Pain in your breasts.  Anything that concerns you.  This information is not intended to replace advice given to you by your health care provider. Make sure you discuss any questions you have with your health care provider. Document Released: 05/29/2005 Document Revised: 11/04/2015 Document Reviewed: 04/18/2015 Elsevier Interactive Patient Education  2018 Elsevier Inc.  

## 2017-09-28 LAB — CBC
Hematocrit: 44 % (ref 34.0–46.6)
Hemoglobin: 13.6 g/dL (ref 11.1–15.9)
MCH: 29.1 pg (ref 26.6–33.0)
MCHC: 30.9 g/dL — ABNORMAL LOW (ref 31.5–35.7)
MCV: 94 fL (ref 79–97)
PLATELETS: 281 10*3/uL (ref 150–379)
RBC: 4.67 x10E6/uL (ref 3.77–5.28)
RDW: 14.5 % (ref 12.3–15.4)
WBC: 7 10*3/uL (ref 3.4–10.8)

## 2017-09-28 LAB — CYTOLOGY - PAP: DIAGNOSIS: NEGATIVE

## 2017-09-28 LAB — TSH: TSH: 0.56 u[IU]/mL (ref 0.450–4.500)

## 2017-10-01 ENCOUNTER — Telehealth: Payer: Self-pay

## 2017-10-01 ENCOUNTER — Telehealth: Payer: Self-pay | Admitting: Obstetrics and Gynecology

## 2017-10-01 MED ORDER — FLUCONAZOLE 150 MG PO TABS: ORAL_TABLET | ORAL | 0 refills | Status: DC

## 2017-10-01 NOTE — Telephone Encounter (Signed)
Appointment reviewed with Dr Oscar LaJertson. Encounter closed.

## 2017-10-01 NOTE — Telephone Encounter (Signed)
Spoke with patient and notified labs normal, pap normal but it did show some yeast. She did state she was symptomatic and will send Rx for Diflucan 150mg  #2, 0Refills to pharmacy as directed by Dr.Jertson.

## 2017-10-01 NOTE — Telephone Encounter (Signed)
Spoke with patient regarding benefit for a recommended ultrasound with possible endometrial biopsy. Patient understood and agreeable. Patient states she started her cycle on 09/28/17, advised patient will review scheduling options with Triage Nurse and return call to her for scheduling. Patient is agreeable.   Routing to Carmelina DaneJill Hamm, RCharity fundraiser

## 2017-10-01 NOTE — Telephone Encounter (Signed)
-----   Message from Romualdo BolkJill Evelyn Jertson, MD sent at 09/28/2017  2:04 PM EDT ----- Please inform the patient that her pap was negative for dysplasia, but there was yeast noted. If she is symptomatic, treat with diflucan 150 mg x 1, may repeat in 72 hours if still symptomatic. #2, no refills Her blood work was normal 02 recall

## 2017-10-01 NOTE — Telephone Encounter (Signed)
Returned call to patient to review scheduling options, as discussed with Billie RuddySally Yeakley, RN. Patient is scheduled on 10/04/17 at 8:00 AM for an ultrasound with a possible endometrial biopsy with Dr Oscar LaJertson. Patient is aware of the appointment date, arrival time and cancellation policy. Patient had no further questions.   Will close encounter  Forwarding to Dr Oscar LaJertson for final review  cc: Billie RuddySally Yeakley, RN

## 2017-10-04 ENCOUNTER — Ambulatory Visit (INDEPENDENT_AMBULATORY_CARE_PROVIDER_SITE_OTHER): Payer: BC Managed Care – PPO

## 2017-10-04 ENCOUNTER — Ambulatory Visit: Payer: BC Managed Care – PPO | Admitting: Obstetrics and Gynecology

## 2017-10-04 ENCOUNTER — Encounter: Payer: Self-pay | Admitting: Obstetrics and Gynecology

## 2017-10-04 VITALS — BP 116/80 | HR 84 | Resp 16 | Ht 66.0 in | Wt 129.0 lb

## 2017-10-04 DIAGNOSIS — N920 Excessive and frequent menstruation with regular cycle: Secondary | ICD-10-CM | POA: Diagnosis not present

## 2017-10-04 DIAGNOSIS — N921 Excessive and frequent menstruation with irregular cycle: Secondary | ICD-10-CM | POA: Diagnosis not present

## 2017-10-04 DIAGNOSIS — Z01812 Encounter for preprocedural laboratory examination: Secondary | ICD-10-CM | POA: Diagnosis not present

## 2017-10-04 LAB — POCT URINE PREGNANCY: PREG TEST UR: NEGATIVE

## 2017-10-04 NOTE — Patient Instructions (Addendum)
Oral Contraception Use Oral contraceptive pills (OCPs) are medicines taken to prevent pregnancy. OCPs work by preventing the ovaries from releasing eggs. The hormones in OCPs also cause the cervical mucus to thicken, preventing the sperm from entering the uterus. The hormones also cause the uterine lining to become thin, not allowing a fertilized egg to attach to the inside of the uterus. OCPs are highly effective when taken exactly as prescribed. However, OCPs do not prevent sexually transmitted d Oral Contraception Information Oral contraceptive pills (OCPs) are medicines taken to prevent pregnancy. OCPs work by preventing the ovaries from releasing eggs. The hormones in OCPs also cause the cervical mucus to thicken, preventing the sperm from entering the uterus. The hormones also cause the uterine lining to become thin, not allowing a fertilized egg to attach to the inside of the uterus. OCPs are highly effective when taken exactly as prescribed. However, OCPs do not prevent sexually transmitted diseases (STDs). Safe sex practices, such as using condoms along with the pill, can help prevent STDs. Before taking the pill, you may have a physical exam and Pap test. Your health care provider may order blood tests. The health care provider will make sure you are a good candidate for oral contraception. Discuss with your health care provider the possible side effects of the OCP you may be prescribed. When starting an OCP, it can take 2 to 3 months for the body to adjust to the changes in hormone levels in your body. Types of oral contraception  The combination pill-This pill contains estrogen and progestin (synthetic progesterone) hormones. The combination pill comes in 21-day, 28-day, or 91-day packs. Some types of combination pills are meant to be taken continuously (365-day pills). With 21-day packs, you do not take pills for 7 days after the last pill. With 28-day packs, the pill is taken every day. The last  7 pills are without hormones. Certain types of pills have more than 21 hormone-containing pills. With 91-day packs, the first 84 pills contain both hormones, and the last 7 pills contain no hormones or contain estrogen only.  The minipill-This pill contains the progesterone hormone only. The pill is taken every day continuously. It is very important to take the pill at the same time each day. The minipill comes in packs of 28 pills. All 28 pills contain the hormone. Advantages of oral contraceptive pills  Decreases premenstrual symptoms.  Treats menstrual period cramps.  Regulates the menstrual cycle.  Decreases a heavy menstrual flow.  May treatacne, depending on the type of pill.  Treats abnormal uterine bleeding.  Treats polycystic ovarian syndrome.  Treats endometriosis.  Can be used as emergency contraception. Things that can make oral contraceptive pills less effective OCPs can be less effective if:  You forget to take the pill at the same time every day.  You have a stomach or intestinal disease that lessens the absorption of the pill.  You take OCPs with other medicines that make OCPs less effective, such as antibiotics, certain HIV medicines, and some seizure medicines.  You take expired OCPs.  You forget to restart the pill on day 7, when using the packs of 21 pills.  Risks associated with oral contraceptive pills Oral contraceptive pills can sometimes cause side effects, such as:  Headache.  Nausea.  Breast tenderness.  Irregular bleeding or spotting.  Combination pills are also associated with a small increased risk of:  Blood clots.  Heart attack.  Stroke.  This information is not intended to replace advice  given to you by your health care provider. Make sure you discuss any questions you have with your health care provider. Document Released: 08/19/2002 Document Revised: 11/04/2015 Document Reviewed: 11/17/2012 Elsevier Interactive Patient  Education  2018 [employer] eases (STDs). Safe sex practices, such as using condoms along with an OCP, can help prevent STDs. Before taking OCPs, you may have a physical exam and Pap test. Your health care provider may also order blood tests if necessary. Your health care provider will make sure you are a good candidate for oral contraception. Discuss with your health care provider the possible side effects of the OCP you may be prescribed. When starting an OCP, it can take 2 to 3 months for the body to adjust to the changes in hormone levels in your body. How to take oral contraceptive pills Your health care provider may advise you on how to start taking the first cycle of OCPs. Otherwise, you can:  Start on day 1 of your menstrual period. You will not need any backup contraceptive protection with this start time.  Start on the first Sunday after your menstrual period or the day you get your prescription. In these cases, you will need to use backup contraceptive protection for the first week.  Start the pill at any time of your cycle. If you take the pill within 5 days of the start of your period, you are protected against pregnancy right away. In this case, you will not need a backup form of birth control. If you start at any other time of your menstrual cycle, you will need to use another form of birth control for 7 days. If your OCP is the type called a minipill, it will protect you from pregnancy after taking it for 2 days (48 hours).  After you have started taking OCPs:  If you forget to take 1 pill, take it as soon as you remember. Take the next pill at the regular time.  If you miss 2 or more pills, call your health care provider because different pills have different instructions for missed doses. Use backup birth control until your next menstrual period starts.  If you use a 28-day pack that contains inactive pills and you miss 1 of the last 7 pills (pills with no hormones), it will  not matter. Throw away the rest of the non-hormone pills and start a new pill pack.  No matter which day you start the OCP, you will always start a new pack on that same day of the week. Have an extra pack of OCPs and a backup contraceptive method available in case you miss some pills or lose your OCP pack. Follow these instructions at home:  Do not smoke.  Always use a condom to protect against STDs. OCPs do not protect against STDs.  Use a calendar to mark your menstrual period days.  Read the information and directions that came with your OCP. Talk to your health care provider if you have questions. Contact a health care provider if:  You develop nausea and vomiting.  You have abnormal vaginal discharge or bleeding.  You develop a rash.  You miss your menstrual period.  You are losing your hair.  You need treatment for mood swings or depression.  You get dizzy when taking the OCP.  You develop acne from taking the OCP.  You become pregnant. Get help right away if:  You develop chest pain.  You develop shortness of breath.  You have an uncontrolled or  severe headache.  You develop numbness or slurred speech.  You develop visual problems.  You develop pain, redness, and swelling in the legs. This information is not intended to replace advice given to you by your health care provider. Make sure you discuss any questions you have with your health care provider. Document Released: 05/18/2011 Document Revised: 11/04/2015 Document Reviewed: 11/17/2012 Elsevier Interactive Patient Education  2017 Elsevier Inc.   Endometrial Biopsy Post-procedure Instructions . Cramping is common.  You may take Ibuprofen, Aleve, or Tylenol for the cramping.  This should resolve within 24 hours.   . You may have a small amount of spotting.  You should wear a mini pad for the next few days. . You may have intercourse in 24 hours. . You need to call the office if you have any pelvic pain,  fever, heavy bleeding, or foul smelling vaginal discharge. . Shower or bathe as normal . You will be notified within one week of your biopsy results or we will discuss your results at your follow-up appointment if needed.
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
## 2017-10-10 ENCOUNTER — Telehealth: Payer: Self-pay | Admitting: *Deleted

## 2017-10-10 NOTE — Telephone Encounter (Signed)
Spoke with patient and gave results. Patient states that she just wants to wait on deciding on any treatment right now. Advised to call if she changes her mind-eh

## 2017-10-10 NOTE — Telephone Encounter (Signed)
-----   Message from Romualdo Bolk, MD sent at 10/09/2017  5:32 PM EDT ----- Please inform the patient that her endometrial biopsy was benign and see if she has decided on treatment option (we discussed doing nothing, OCP's, or a mirena IUD). Thanks

## 2017-10-10 NOTE — Telephone Encounter (Signed)
Left message to call regarding results -eh 

## 2017-10-10 NOTE — Telephone Encounter (Signed)
Return call to Elaine. °

## 2018-10-14 ENCOUNTER — Ambulatory Visit: Payer: BC Managed Care – PPO | Admitting: Obstetrics and Gynecology

## 2018-10-31 ENCOUNTER — Other Ambulatory Visit: Payer: Self-pay

## 2018-11-05 ENCOUNTER — Ambulatory Visit: Payer: BC Managed Care – PPO | Admitting: Obstetrics and Gynecology
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
## 2018-12-11 ENCOUNTER — Ambulatory Visit: Payer: BC Managed Care – PPO | Admitting: Obstetrics and Gynecology

## 2018-12-11 ENCOUNTER — Other Ambulatory Visit: Payer: Self-pay | Admitting: Family Medicine

## 2018-12-11 ENCOUNTER — Other Ambulatory Visit: Payer: Self-pay

## 2018-12-11 ENCOUNTER — Encounter: Payer: Self-pay | Admitting: Obstetrics and Gynecology

## 2018-12-11 VITALS — BP 114/68 | HR 72 | Temp 98.1°F | Ht 66.0 in | Wt 138.2 lb

## 2018-12-11 DIAGNOSIS — N3946 Mixed incontinence: Secondary | ICD-10-CM

## 2018-12-11 DIAGNOSIS — Z01419 Encounter for gynecological examination (general) (routine) without abnormal findings: Secondary | ICD-10-CM

## 2018-12-11 DIAGNOSIS — Z1231 Encounter for screening mammogram for malignant neoplasm of breast: Secondary | ICD-10-CM

## 2018-12-11 DIAGNOSIS — N951 Menopausal and female climacteric states: Secondary | ICD-10-CM | POA: Diagnosis not present

## 2018-12-11 NOTE — Patient Instructions (Signed)
EXERCISE AND DIET:  We recommended that you start or continue a regular exercise program for good health. Regular exercise means any activity that makes your heart beat faster and makes you sweat.  We recommend exercising at least 30 minutes per day at least 3 days a week, preferably 4 or 5.  We also recommend a diet low in fat and sugar.  Inactivity, poor dietary choices and obesity can cause diabetes, heart attack, stroke, and kidney damage, among others.    ALCOHOL AND SMOKING:  Women should limit their alcohol intake to no more than 7 drinks/beers/glasses of wine (combined, not each!) per week. Moderation of alcohol intake to this level decreases your risk of breast cancer and liver damage. And of course, no recreational drugs are part of a healthy lifestyle.  And absolutely no smoking or even second hand smoke. Most people know smoking can cause heart and lung diseases, but did you know it also contributes to weakening of your bones? Aging of your skin?  Yellowing of your teeth and nails?  CALCIUM AND VITAMIN D:  Adequate intake of calcium and Vitamin D are recommended.  The recommendations for exact amounts of these supplements seem to change often, but generally speaking 1,000 mg of calcium (between diet and supplement) and 800 units of Vitamin D per day seems prudent. Certain women may benefit from higher intake of Vitamin D.  If you are among these women, your doctor will have told you during your visit.    PAP SMEARS:  Pap smears, to check for cervical cancer or precancers,  have traditionally been done yearly, although recent scientific advances have shown that most women can have pap smears less often.  However, every woman still should have a physical exam from her gynecologist every year. It will include a breast check, inspection of the vulva and vagina to check for abnormal growths or skin changes, a visual exam of the cervix, and then an exam to evaluate the size and shape of the uterus and  ovaries.  And after 49 years of age, a rectal exam is indicated to check for rectal cancers. We will also provide age appropriate advice regarding health maintenance, like when you should have certain vaccines, screening for sexually transmitted diseases, bone density testing, colonoscopy, mammograms, etc.   MAMMOGRAMS:  All women over 49 years old should have a yearly mammogram. Many facilities now offer a "3D" mammogram, which may cost around $50 extra out of pocket. If possible,  we recommend you accept the option to have the 3D mammogram performed.  It both reduces the number of women who will be called back for extra views which then turn out to be normal, and it is better than the routine mammogram at detecting truly abnormal areas.    COLON CANCER SCREENING: Now recommend starting at age 49. At this time colonoscopy is not covered for routine screening until 50. There are take home tests that can be done between 45-49.   COLONOSCOPY:  Colonoscopy to screen for colon cancer is recommended for all women at age 49.  We know, you hate the idea of the prep.  We agree, BUT, having colon cancer and not knowing it is worse!!  Colon cancer so often starts as a polyp that can be seen and removed at colonscopy, which can quite literally save your life!  And if your first colonoscopy is normal and you have no family history of colon cancer, most women don't have to have it again for  10 years.  Once every ten years, you can do something that may end up saving your life, right?  We will be happy to help you get it scheduled when you are ready.  Be sure to check your insurance coverage so you understand how much it will cost.  It may be covered as a preventative service at no cost, but you should check your particular policy.   ° ° ° °Breast Self-Awareness °Breast self-awareness means being familiar with how your breasts look and feel. It involves checking your breasts regularly and reporting any changes to your  health care provider. °Practicing breast self-awareness is important. A change in your breasts can be a sign of a serious medical problem. Being familiar with how your breasts look and feel allows you to find any problems early, when treatment is more likely to be successful. All women should practice breast self-awareness, including women who have had breast implants. °How to do a breast self-exam °One way to learn what is normal for your breasts and whether your breasts are changing is to do a breast self-exam. To do a breast self-exam: °Look for Changes ° °1. Remove all the clothing above your waist. °2. Stand in front of a mirror in a room with good lighting. °3. Put your hands on your hips. °4. Push your hands firmly downward. °5. Compare your breasts in the mirror. Look for differences between them (asymmetry), such as: °? Differences in shape. °? Differences in size. °? Puckers, dips, and bumps in one breast and not the other. °6. Look at each breast for changes in your skin, such as: °? Redness. °? Scaly areas. °7. Look for changes in your nipples, such as: °? Discharge. °? Bleeding. °? Dimpling. °? Redness. °? A change in position. °Feel for Changes °Carefully feel your breasts for lumps and changes. It is best to do this while lying on your back on the floor and again while sitting or standing in the shower or tub with soapy water on your skin. Feel each breast in the following way: °· Place the arm on the side of the breast you are examining above your head. °· Feel your breast with the other hand. °· Start in the nipple area and make ¾ inch (2 cm) overlapping circles to feel your breast. Use the pads of your three middle fingers to do this. Apply light pressure, then medium pressure, then firm pressure. The light pressure will allow you to feel the tissue closest to the skin. The medium pressure will allow you to feel the tissue that is a little deeper. The firm pressure will allow you to feel the tissue  close to the ribs. °· Continue the overlapping circles, moving downward over the breast until you feel your ribs below your breast. °· Move one finger-width toward the center of the body. Continue to use the ¾ inch (2 cm) overlapping circles to feel your breast as you move slowly up toward your collarbone. °· Continue the up and down exam using all three pressures until you reach your armpit. ° °Write Down What You Find ° °Write down what is normal for each breast and any changes that you find. Keep a written record with breast changes or normal findings for each breast. By writing this information down, you do not need to depend only on memory for size, tenderness, or location. Write down where you are in your menstrual cycle, if you are still menstruating. °If you are having trouble noticing differences   in your breasts, do not get discouraged. With time you will become more familiar with the variations in your breasts and more comfortable with the exam. °How often should I examine my breasts? °Examine your breasts every month. If you are breastfeeding, the best time to examine your breasts is after a feeding or after using a breast pump. If you menstruate, the best time to examine your breasts is 5-7 days after your period is over. During your period, your breasts are lumpier, and it may be more difficult to notice changes. °When should I see my health care provider? °See your health care provider if you notice: °· A change in shape or size of your breasts or nipples. °· A change in the skin of your breast or nipples, such as a reddened or scaly area. °· Unusual discharge from your nipples. °· A lump or thick area that was not there before. °· Pain in your breasts. °· Anything that concerns you. ° °Urinary Incontinence ° °Urinary incontinence refers to a condition in which a person is unable to control where and when to pass urine. A person with this condition will urinate when he or she does not mean to  (involuntarily). °What are the causes? °This condition may be caused by: °· Medicines. °· Infections. °· Constipation. °· Overactive bladder muscles. °· Weak bladder muscles. °· Weak pelvic floor muscles. These muscles provide support for the bladder, intestine, and, in women, the uterus. °· Enlarged prostate in men. The prostate is a gland near the bladder. When it gets too big, it can pinch the urethra. With the urethra blocked, the bladder can weaken and lose the ability to empty properly. °· Surgery. °· Emotional factors, such as anxiety, stress, or post-traumatic stress disorder (PTSD). °· Pelvic organ prolapse. This happens in women when organs shift out of place and into the vagina. This shift can prevent the bladder and urethra from working properly. °What increases the risk? °The following factors may make you more likely to develop this condition: °· Older age. °· Obesity and physical inactivity. °· Pregnancy and childbirth. °· Menopause. °· Diseases that affect the nerves or spinal cord (neurological diseases). °· Long-term (chronic) coughing. This can increase pressure on the bladder and pelvic floor muscles. °What are the signs or symptoms? °Symptoms may vary depending on the type of urinary incontinence you have. They include: °· A sudden urge to urinate, but passing urine involuntarily before you can get to a bathroom (urge incontinence). °· Suddenly passing urine with any activity that forces urine to pass, such as coughing, laughing, exercise, or sneezing (stress incontinence). °· Needing to urinate often, but urinating only a small amount, or constantly dribbling urine (overflow incontinence). °· Urinating because you cannot get to the bathroom in time due to a physical disability, such as arthritis or injury, or communication and thinking problems, such as Alzheimer disease (functional incontinence). °How is this diagnosed? °This condition may be diagnosed based on: °· Your medical history. °· A  physical exam. °· Tests, such as: °? Urine tests. °? X-rays of your kidney and bladder. °? Ultrasound. °? CT scan. °? Cystoscopy. In this procedure, a health care provider inserts a tube with a light and camera (cystoscope) through the urethra and into the bladder in order to check for problems. °? Urodynamic testing. These tests assess how well the bladder, urethra, and sphincter can store and release urine. There are different types of urodynamic tests, and they vary depending on what the test is measuring. °  To help diagnose your condition, your health care provider may recommend that you keep a log of when you urinate and how much you urinate. °How is this treated? °Treatment for this condition depends on the type of incontinence that you have and its cause. Treatment may include: °· Lifestyle changes, such as: °? Quitting smoking. °? Maintaining a healthy weight. °? Staying active. Try to get 150 minutes of moderate-intensity exercise every week. Ask your health care provider which activities are safe for you. °? Eating a healthy diet. °§ Avoid high-fat foods, like fried foods. °§ Avoid refined carbohydrates like white bread and white rice. °§ Limit how much alcohol and caffeine you drink. °§ Increase your fiber intake. Foods such as fresh fruits, vegetables, beans, and whole grains are healthy sources of fiber. °· Pelvic floor muscle exercises. °· Bladder training, such as lengthening the amount of time between bathroom breaks, or using the bathroom at regular intervals. °· Using techniques to suppress bladder urges. This can include distraction techniques or controlled breathing exercises. °· Medicines to relax the bladder muscles and prevent bladder spasms. °· Medicines to help slow or prevent the growth of a man's prostate. °· Botox injections. These can help relax the bladder muscles. °· Using pulses of electricity to help change bladder reflexes (electrical nerve stimulation). °· For women, using a medical  device to prevent urine leaks. This is a small, tampon-like, disposable device that is inserted into the urethra. °· Injecting collagen or carbon beads (bulking agents) into the urinary sphincter. These can help thicken tissue and close the bladder opening. °· Surgery. °Follow these instructions at home: °Lifestyle °· Limit alcohol and caffeine. These can fill your bladder quickly and irritate it. °· Keep yourself clean to help prevent odors and skin damage. Ask your doctor about special skin creams and cleansers that can protect the skin from urine. °· Consider wearing pads or adult diapers. Make sure to change them regularly, and always change them right after experiencing incontinence. °General instructions °· Take over-the-counter and prescription medicines only as told by your health care provider. °· Use the bathroom about every 3-4 hours, even if you do not feel the need to urinate. Try to empty your bladder completely every time. After urinating, wait a minute. Then try to urinate again. °· Make sure you are in a relaxed position while urinating. °· If your incontinence is caused by nerve problems, keep a log of the medicines you take and the times you go to the bathroom. °· Keep all follow-up visits as told by your health care provider. This is important. °Contact a health care provider if: °· You have pain that gets worse. °· Your incontinence gets worse. °Get help right away if: °· You have a fever or chills. °· You are unable to urinate. °· You have redness in your groin area or down your legs. °Summary °· Urinary incontinence refers to a condition in which a person is unable to control where and when to pass urine. °· This condition may be caused by medicines, infection, weak bladder muscles, weak pelvic floor muscles, enlargement of the prostate (in men), or surgery. °· The following factors increase your risk for developing this condition: older age, obesity, pregnancy and childbirth, menopause,  neurological diseases, and chronic coughing. °· There are several types of urinary incontinence. They include urge incontinence, stress incontinence, overflow incontinence, and functional incontinence. °· This condition is usually treated first with lifestyle and behavioral changes, such as quitting smoking, eating a healthier   healthier diet, and doing regular pelvic floor exercises. Other treatment options include medicines, bulking agents, medical devices, electrical nerve stimulation, or surgery. °This information is not intended to replace advice given to you by your health care provider. Make sure you discuss any questions you have with your health care provider. °Document Released: 07/06/2004 Document Revised: 06/08/2017 Document Reviewed: 09/07/2016 °[employer]vier Patient Education © 2020 [employer] ° °Kegel Exercises ° °Kegel exercises can help strengthen your pelvic floor muscles. The pelvic floor is a group of muscles that support your rectum, small intestine, and bladder. In females, pelvic floor muscles also help support the womb (uterus). These muscles help you control the flow of urine and stool. °Kegel exercises are painless and simple, and they do not require any equipment. Your provider may suggest Kegel exercises to: °· Improve bladder and bowel control. °· Improve sexual response. °· Improve weak pelvic floor muscles after surgery to remove the uterus (hysterectomy) or pregnancy (females). °· Improve weak pelvic floor muscles after prostate gland removal or surgery (males). °Kegel exercises involve squeezing your pelvic floor muscles, which are the same muscles you squeeze when you try to stop the flow of urine or keep from passing gas. The exercises can be done while sitting, standing, or lying down, but it is best to vary your position. °Exercises °How to do Kegel exercises: °1. Squeeze your pelvic floor muscles tight. You should feel a tight lift in your rectal area. If you are a female, you should also  feel a tightness in your vaginal area. Keep your stomach, buttocks, and legs relaxed. °2. Hold the muscles tight for up to 10 seconds. °3. Breathe normally. °4. Relax your muscles. °5. Repeat as told by your health care provider. °Repeat this exercise daily as told by your health care provider. Continue to do this exercise for at least 4-6 weeks, or for as long as told by your health care provider. °You may be referred to a physical therapist who can help you learn more about how to do Kegel exercises. °Depending on your condition, your health care provider may recommend: °· Varying how long you squeeze your muscles. °· Doing several sets of exercises every day. °· Doing exercises for several weeks. °· Making Kegel exercises a part of your regular exercise routine. °This information is not intended to replace advice given to you by your health care provider. Make sure you discuss any questions you have with your health care provider. °Document Released: 05/15/2012 Document Revised: 01/16/2018 Document Reviewed: 01/16/2018 °[employer]vier Patient Education © 2020 [employer] ° °

## 2018-12-12 LAB — URINALYSIS, MICROSCOPIC ONLY
Bacteria, UA: NONE SEEN
Casts: NONE SEEN /lpf
RBC: NONE SEEN /hpf (ref 0–2)
WBC, UA: NONE SEEN /hpf (ref 0–5)

## 2018-12-12 LAB — URINE CULTURE

## 2019-01-24 ENCOUNTER — Ambulatory Visit
Admission: RE | Admit: 2019-01-24 | Discharge: 2019-01-24 | Disposition: A | Discharge status: Home / Self Care | Payer: BC Managed Care – PPO | Source: Ambulatory Visit | Attending: Family Medicine | Admitting: Family Medicine

## 2019-01-24 ENCOUNTER — Other Ambulatory Visit: Payer: Self-pay

## 2019-01-24 DIAGNOSIS — Z1231 Encounter for screening mammogram for malignant neoplasm of breast: Secondary | ICD-10-CM

## 2019-04-02 ENCOUNTER — Other Ambulatory Visit: Payer: Self-pay

## 2019-04-02 ENCOUNTER — Telehealth: Payer: BC Managed Care – PPO | Admitting: Nurse Practitioner

## 2019-04-02 DIAGNOSIS — Z20822 Contact with and (suspected) exposure to covid-19: Secondary | ICD-10-CM

## 2019-04-02 DIAGNOSIS — R05 Cough: Secondary | ICD-10-CM

## 2019-04-02 DIAGNOSIS — R5081 Fever presenting with conditions classified elsewhere: Secondary | ICD-10-CM

## 2019-04-02 DIAGNOSIS — R059 Cough, unspecified: Secondary | ICD-10-CM

## 2019-04-02 DIAGNOSIS — Z20828 Contact with and (suspected) exposure to other viral communicable diseases: Secondary | ICD-10-CM

## 2019-04-02 MED ORDER — BENZONATATE 100 MG PO CAPS: 100.0000 mg | ORAL_CAPSULE | Freq: Three times a day (TID) | ORAL | 0 refills | Status: DC | PRN

## 2019-04-02 NOTE — Progress Notes (Signed)
E-Visit for Corona Virus Screening   Your current symptoms could be consistent with the coronavirus.  Many health care providers can now test patients at their office but not all are.  [facility] has multiple testing sites. For information on our COVID testing locations and hours go to [email or web address]  Please quarantine yourself while awaiting your test results.  We are enrolling you in our MyChart Home Montioring for COVID19 . Daily you will receive a questionnaire within the MyChart website. Our COVID 19 response team willl be monitoriing your responses daily.  You can go to one of the  testing sites listed below, while they are opened (see hours). You do not need a doctors order to be tested for covid.You do need to self-isolate until your results return and if positive 14 days from when your symptoms started and until you are 3 days symptom free.   Testing Locations (Monday - Friday, 8 a.m. - 3:30 p.m.)   [place]:  [facility] (Visitor Entrance), [address], [place], [place] -   [place]: [facility], [address], [place], [place] (entrance off [address]) -   [place] (Closed each Monday): [address], [place], [place] - the short stay covered drive at [facility] (Use the [address] entrance to [facility] next to [facility].) -     COVID-19 is a respiratory illness with symptoms that are similar to the flu. Symptoms are typically mild to moderate, but there have been cases of severe illness and death due to the virus. The following symptoms may appear 2-14 days after exposure: . Fever . Cough . Shortness of breath or difficulty breathing . Chills . Repeated shaking with chills . Muscle pain . Headache . Sore throat . New loss of taste or smell . Fatigue . Congestion or runny nose . Nausea or vomiting . Diarrhea  It is vitally  important that if you feel that you have an infection such as this virus or any other virus that you stay home and away from places where you may spread it to others.  You should self-quarantine for 14 days if you have symptoms that could potentially be coronavirus or have been in close contact a with a person diagnosed with COVID-19 within the last 2 weeks. You should avoid contact with people age 65 and older.   You should wear a mask or cloth face covering over your nose and mouth if you must be around other people or animals, including pets (even at home). Try to stay at least 6 feet away from other people. This will protect the people around you.  You can use medication such as A prescription cough medication called Tessalon Perles 100 mg. You may take 1-2 capsules every 8 hours as needed for cough  You may also take acetaminophen (Tylenol) as needed for fever.   Reduce your risk of any infection by using the same precautions used for avoiding the common cold or flu:  . Wash your hands often with soap and warm water for at least 20 seconds.  If soap and water are not readily available, use an alcohol-based hand sanitizer with at least 60% alcohol.  . If coughing or sneezing, cover your mouth and nose by coughing or sneezing into the elbow areas of your shirt or coat, into a tissue or into your sleeve (not your hands). . Avoid shaking hands with others and consider head nods or   verbal greetings only. . Avoid touching your eyes, nose, or mouth with unwashed hands.  . Avoid close contact with people who are sick. . Avoid places or events with large numbers of people in one location, like concerts or sporting events. . Carefully consider travel plans you have or are making. . If you are planning any travel outside or inside the [place], visit the CDC's Travelers' Health webpage for the latest health notices. . If you have some symptoms but not all symptoms, continue to monitor at home and seek medical  attention if your symptoms worsen. . If you are having a medical emergency, call 911.  HOME CARE . Only take medications as instructed by your medical team. . Drink plenty of fluids and get plenty of rest. . A steam or ultrasonic humidifier can help if you have congestion.   GET HELP RIGHT AWAY IF YOU HAVE EMERGENCY WARNING SIGNS** FOR COVID-19. If you or someone is showing any of these signs seek emergency medical care immediately. Call 911 or proceed to your closest emergency facility if: . You develop worsening high fever. . Trouble breathing . Bluish lips or face . Persistent pain or pressure in the chest . New confusion . Inability to wake or stay awake . You cough up blood. . Your symptoms become more severe  **This list is not all possible symptoms. Contact your medical provider for any symptoms that are sever or concerning to you.   MAKE SURE YOU   Understand these instructions.  Will watch your condition.  Will get help right away if you are not doing well or get worse.  Your e-visit answers were reviewed by a board certified advanced clinical practitioner to complete your personal care plan.  Depending on the condition, your plan could have included both over the counter or prescription medications.  If there is a problem please reply once you have received a response from your provider.  Your safety is important to us.  If you have drug allergies check your prescription carefully.    You can use MyChart to ask questions about today's visit, request a non-urgent call back, or ask for a work or school excuse for 24 hours related to this e-Visit. If it has been greater than 24 hours you will need to follow up with your provider, or enter a new e-Visit to address those concerns. You will get an e-mail in the next two days asking about your experience.  I hope that your e-visit has been valuable and will speed your recovery. Thank you for using e-visits.   5-10 minutes  spent reviewing and documenting in chart.  

## 2019-04-04 LAB — NOVEL CORONAVIRUS, NAA: SARS-CoV-2, NAA: NOT DETECTED
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
## 2019-12-17 ENCOUNTER — Other Ambulatory Visit: Payer: Self-pay

## 2019-12-17 ENCOUNTER — Encounter: Payer: Self-pay | Admitting: Obstetrics and Gynecology

## 2019-12-17 ENCOUNTER — Ambulatory Visit: Payer: BC Managed Care – PPO | Admitting: Obstetrics and Gynecology

## 2019-12-17 VITALS — BP 110/64 | HR 83 | Ht 65.75 in | Wt 134.5 lb

## 2019-12-17 DIAGNOSIS — N951 Menopausal and female climacteric states: Secondary | ICD-10-CM | POA: Diagnosis not present

## 2019-12-17 DIAGNOSIS — Z124 Encounter for screening for malignant neoplasm of cervix: Secondary | ICD-10-CM

## 2019-12-17 DIAGNOSIS — Z01419 Encounter for gynecological examination (general) (routine) without abnormal findings: Secondary | ICD-10-CM

## 2019-12-17 MED ORDER — MEDROXYPROGESTERONE ACETATE 5 MG PO TABS: ORAL_TABLET | ORAL | 1 refills | Status: AC

## 2019-12-17 NOTE — Patient Instructions (Addendum)
Perimenopause  Perimenopause is the normal time of life before and after menstrual periods stop completely (menopause). Perimenopause can begin 2-8 years before menopause, and it usually lasts for 1 year after menopause. During perimenopause, the ovaries may or may not produce an egg. What are the causes? This condition is caused by a natural change in hormone levels that happens as you get older. What increases the risk? This condition is more likely to start at an earlier age if you have certain medical conditions or treatments, including:  A tumor of the pituitary gland in the brain.  A disease that affects the ovaries and hormone production.  Radiation treatment for cancer.  Certain cancer treatments, such as chemotherapy or hormone (anti-estrogen) therapy.  Heavy smoking and excessive alcohol use.  Family history of early menopause. What are the signs or symptoms? Perimenopausal changes affect each woman differently. Symptoms of this condition may include:  Hot flashes.  Night sweats.  Irregular menstrual periods.  Decreased sex drive.  Vaginal dryness.  Headaches.  Mood swings.  Depression.  Memory problems or trouble concentrating.  Irritability.  Tiredness.  Weight gain.  Anxiety.  Trouble getting pregnant. How is this diagnosed? This condition is diagnosed based on your medical history, a physical exam, your age, your menstrual history, and your symptoms. Hormone tests may also be done. How is this treated? In some cases, no treatment is needed. You and your health care provider should make a decision together about whether treatment is necessary. Treatment will be based on your individual condition and preferences. Various treatments are available, such as:  Menopausal hormone therapy (MHT).  Medicines to treat specific symptoms.  Acupuncture.  Vitamin or herbal supplements. Before starting treatment, make sure to let your health care provider  know if you have a personal or family history of:  Heart disease.  Breast cancer.  Blood clots.  Diabetes.  Osteoporosis. Follow these instructions at home: Lifestyle  Do not use any products that contain nicotine or tobacco, such as cigarettes and e-cigarettes. If you need help quitting, ask your health care provider.  Eat a balanced diet that includes fresh fruits and vegetables, whole grains, soybeans, eggs, lean meat, and low-fat dairy.  Get at least 30 minutes of physical activity on 5 or more days each week.  Avoid alcoholic and caffeinated beverages, as well as spicy foods. This may help prevent hot flashes.  Get 7-8 hours of sleep each night.  Dress in layers that can be removed to help you manage hot flashes.  Find ways to manage stress, such as deep breathing, meditation, or journaling. General instructions  Keep track of your menstrual periods, including: ? When they occur. ? How heavy they are and how long they last. ? How much time passes between periods.  Keep track of your symptoms, noting when they start, how often you have them, and how long they last.  Take over-the-counter and prescription medicines only as told by your health care provider.  Take vitamin supplements only as told by your health care provider. These may include calcium, vitamin E, and vitamin D.  Use vaginal lubricants or moisturizers to help with vaginal dryness and improve comfort during sex.  Talk with your health care provider before starting any herbal supplements.  Keep all follow-up visits as told by your health care provider. This is important. This includes any group therapy or counseling. Contact a health care provider if:  You have heavy vaginal bleeding or pass blood clots.  Your period   lasts more than 2 days longer than normal.  Your periods are recurring sooner than 21 days.  You bleed after having sex. Get help right away if:  You have chest pain, trouble  breathing, or trouble talking.  You have severe depression.  You have pain when you urinate.  You have severe headaches.  You have vision problems. Summary  Perimenopause is the time when a woman's body begins to move into menopause. This may happen naturally or as a result of other health problems or medical treatments.  Perimenopause can begin 2-8 years before menopause, and it usually lasts for 1 year after menopause.  Perimenopausal symptoms can be managed through medicines, lifestyle changes, and complementary therapies such as acupuncture. This information is not intended to replace advice given to you by your health care provider. Make sure you discuss any questions you have with your health care provider. Document Revised: 05/11/2017 Document Reviewed: 07/04/2016 Elsevier Patient Education  2020 Elsevier Inc.   EXERCISE AND DIET:  We recommended that you start or continue a regular exercise program for good health. Regular exercise means any activity that makes your heart beat faster and makes you sweat.  We recommend exercising at least 30 minutes per day at least 3 days a week, preferably 4 or 5.  We also recommend a diet low in fat and sugar.  Inactivity, poor dietary choices and obesity can cause diabetes, heart attack, stroke, and kidney damage, among others.    ALCOHOL AND SMOKING:  Women should limit their alcohol intake to no more than 7 drinks/beers/glasses of wine (combined, not each!) per week. Moderation of alcohol intake to this level decreases your risk of breast cancer and liver damage. And of course, no recreational drugs are part of a healthy lifestyle.  And absolutely no smoking or even second hand smoke. Most people know smoking can cause heart and lung diseases, but did you know it also contributes to weakening of your bones? Aging of your skin?  Yellowing of your teeth and nails?  CALCIUM AND VITAMIN D:  Adequate intake of calcium and Vitamin D are recommended.   The recommendations for exact amounts of these supplements seem to change often, but generally speaking 1,000 mg of calcium (between diet and supplement) and 800 units of Vitamin D per day seems prudent. Certain women may benefit from higher intake of Vitamin D.  If you are among these women, your doctor will have told you during your visit.    PAP SMEARS:  Pap smears, to check for cervical cancer or precancers,  have traditionally been done yearly, although recent scientific advances have shown that most women can have pap smears less often.  However, every woman still should have a physical exam from her gynecologist every year. It will include a breast check, inspection of the vulva and vagina to check for abnormal growths or skin changes, a visual exam of the cervix, and then an exam to evaluate the size and shape of the uterus and ovaries.  And after 50 years of age, a rectal exam is indicated to check for rectal cancers. We will also provide age appropriate advice regarding health maintenance, like when you should have certain vaccines, screening for sexually transmitted diseases, bone density testing, colonoscopy, mammograms, etc.   MAMMOGRAMS:  All women over 40 years old should have a yearly mammogram. Many facilities now offer a "3D" mammogram, which may cost around $50 extra out of pocket. If possible,  we recommend you accept the option   to have the 3D mammogram performed.  It both reduces the number of women who will be called back for extra views which then turn out to be normal, and it is better than the routine mammogram at detecting truly abnormal areas.    COLON CANCER SCREENING: Now recommend starting at age 45. At this time colonoscopy is not covered for routine screening until 50. There are take home tests that can be done between 45-49.   COLONOSCOPY:  Colonoscopy to screen for colon cancer is recommended for all women at age 50.  We know, you hate the idea of the prep.  We agree, BUT,  having colon cancer and not knowing it is worse!!  Colon cancer so often starts as a polyp that can be seen and removed at colonscopy, which can quite literally save your life!  And if your first colonoscopy is normal and you have no family history of colon cancer, most women don't have to have it again for 10 years.  Once every ten years, you can do something that may end up saving your life, right?  We will be happy to help you get it scheduled when you are ready.  Be sure to check your insurance coverage so you understand how much it will cost.  It may be covered as a preventative service at no cost, but you should check your particular policy.      Breast Self-Awareness Breast self-awareness means being familiar with how your breasts look and feel. It involves checking your breasts regularly and reporting any changes to your health care provider. Practicing breast self-awareness is important. A change in your breasts can be a sign of a serious medical problem. Being familiar with how your breasts look and feel allows you to find any problems early, when treatment is more likely to be successful. All women should practice breast self-awareness, including women who have had breast implants. How to do a breast self-exam One way to learn what is normal for your breasts and whether your breasts are changing is to do a breast self-exam. To do a breast self-exam: Look for Changes  1. Remove all the clothing above your waist. 2. Stand in front of a mirror in a room with good lighting. 3. Put your hands on your hips. 4. Push your hands firmly downward. 5. Compare your breasts in the mirror. Look for differences between them (asymmetry), such as: ? Differences in shape. ? Differences in size. ? Puckers, dips, and bumps in one breast and not the other. 6. Look at each breast for changes in your skin, such as: ? Redness. ? Scaly areas. 7. Look for changes in your nipples, such  as: ? Discharge. ? Bleeding. ? Dimpling. ? Redness. ? A change in position. Feel for Changes Carefully feel your breasts for lumps and changes. It is best to do this while lying on your back on the floor and again while sitting or standing in the shower or tub with soapy water on your skin. Feel each breast in the following way:  Place the arm on the side of the breast you are examining above your head.  Feel your breast with the other hand.  Start in the nipple area and make  inch (2 cm) overlapping circles to feel your breast. Use the pads of your three middle fingers to do this. Apply light pressure, then medium pressure, then firm pressure. The light pressure will allow you to feel the tissue closest to the skin. The   medium pressure will allow you to feel the tissue that is a little deeper. The firm pressure will allow you to feel the tissue close to the ribs.  Continue the overlapping circles, moving downward over the breast until you feel your ribs below your breast.  Move one finger-width toward the center of the body. Continue to use the  inch (2 cm) overlapping circles to feel your breast as you move slowly up toward your collarbone.  Continue the up and down exam using all three pressures until you reach your armpit.  Write Down What You Find  Write down what is normal for each breast and any changes that you find. Keep a written record with breast changes or normal findings for each breast. By writing this information down, you do not need to depend only on memory for size, tenderness, or location. Write down where you are in your menstrual cycle, if you are still menstruating. If you are having trouble noticing differences in your breasts, do not get discouraged. With time you will become more familiar with the variations in your breasts and more comfortable with the exam. How often should I examine my breasts? Examine your breasts every month. If you are breastfeeding, the  best time to examine your breasts is after a feeding or after using a breast pump. If you menstruate, the best time to examine your breasts is 5-7 days after your period is over. During your period, your breasts are lumpier, and it may be more difficult to notice changes. When should I see my health care provider? See your health care provider if you notice:  A change in shape or size of your breasts or nipples.  A change in the skin of your breast or nipples, such as a reddened or scaly area.  Unusual discharge from your nipples.  A lump or thick area that was not there before.  Pain in your breasts.  Anything that concerns you.  

## 2019-12-22 ENCOUNTER — Other Ambulatory Visit (HOSPITAL_COMMUNITY)
Admission: RE | Admit: 2019-12-22 | Discharge: 2019-12-22 | Disposition: A | Discharge status: Home / Self Care | Payer: BC Managed Care – PPO | Source: Ambulatory Visit | Attending: Obstetrics and Gynecology | Admitting: Obstetrics and Gynecology

## 2019-12-22 DIAGNOSIS — Z124 Encounter for screening for malignant neoplasm of cervix: Secondary | ICD-10-CM | POA: Diagnosis not present

## 2019-12-22 NOTE — Addendum Note (Signed)
Addended by: Tobi Bastos on: 12/22/2019 03:45 PM   Modules accepted: Orders

## 2019-12-23 LAB — CYTOLOGY - PAP
Comment: NEGATIVE
Diagnosis: NEGATIVE
High risk HPV: NEGATIVE

## 2020-02-23 ENCOUNTER — Other Ambulatory Visit: Payer: Self-pay | Admitting: Family Medicine

## 2020-02-23 DIAGNOSIS — Z1231 Encounter for screening mammogram for malignant neoplasm of breast: Secondary | ICD-10-CM

## 2020-03-03 ENCOUNTER — Other Ambulatory Visit: Payer: Self-pay

## 2020-03-03 ENCOUNTER — Ambulatory Visit
Admission: RE | Admit: 2020-03-03 | Discharge: 2020-03-03 | Disposition: A | Discharge status: Home / Self Care | Payer: BC Managed Care – PPO | Source: Ambulatory Visit | Attending: Family Medicine | Admitting: Family Medicine

## 2020-03-03 ENCOUNTER — Other Ambulatory Visit: Payer: Self-pay | Admitting: Family Medicine

## 2020-03-03 DIAGNOSIS — R059 Cough, unspecified: Secondary | ICD-10-CM

## 2020-03-03 DIAGNOSIS — Z1231 Encounter for screening mammogram for malignant neoplasm of breast: Secondary | ICD-10-CM

## 2020-12-23 ENCOUNTER — Ambulatory Visit: Payer: BC Managed Care – PPO | Admitting: Obstetrics and Gynecology
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
## 2021-01-07 ENCOUNTER — Other Ambulatory Visit: Payer: Self-pay

## 2021-01-07 ENCOUNTER — Encounter: Payer: Self-pay | Admitting: Obstetrics and Gynecology

## 2021-01-07 ENCOUNTER — Ambulatory Visit (INDEPENDENT_AMBULATORY_CARE_PROVIDER_SITE_OTHER): Payer: BC Managed Care – PPO | Admitting: Obstetrics and Gynecology

## 2021-01-07 VITALS — BP 134/78 | HR 72 | Ht 65.0 in | Wt 149.0 lb

## 2021-01-07 DIAGNOSIS — Z01419 Encounter for gynecological examination (general) (routine) without abnormal findings: Secondary | ICD-10-CM | POA: Diagnosis not present

## 2021-01-07 DIAGNOSIS — H1045 Other chronic allergic conjunctivitis: Secondary | ICD-10-CM | POA: Insufficient documentation

## 2021-01-07 DIAGNOSIS — J3081 Allergic rhinitis due to animal (cat) (dog) hair and dander: Secondary | ICD-10-CM | POA: Insufficient documentation

## 2021-01-07 DIAGNOSIS — Z23 Encounter for immunization: Secondary | ICD-10-CM

## 2021-01-07 DIAGNOSIS — J4530 Mild persistent asthma, uncomplicated: Secondary | ICD-10-CM | POA: Insufficient documentation

## 2021-01-07 DIAGNOSIS — J301 Allergic rhinitis due to pollen: Secondary | ICD-10-CM | POA: Insufficient documentation

## 2021-01-07 DIAGNOSIS — N393 Stress incontinence (female) (male): Secondary | ICD-10-CM

## 2021-01-07 LAB — URINALYSIS, COMPLETE W/RFL CULTURE
Bacteria, UA: NONE SEEN /HPF
Bilirubin Urine: NEGATIVE
Glucose, UA: NEGATIVE
Hgb urine dipstick: NEGATIVE
Hyaline Cast: NONE SEEN /LPF
Ketones, ur: NEGATIVE
Leukocyte Esterase: NEGATIVE
Nitrites, Initial: NEGATIVE
Protein, ur: NEGATIVE
RBC / HPF: NONE SEEN /HPF (ref 0–2)
Specific Gravity, Urine: 1.01 (ref 1.001–1.035)
WBC, UA: NONE SEEN /HPF (ref 0–5)
pH: 7 (ref 5.0–8.0)

## 2021-01-07 LAB — NO CULTURE INDICATED

## 2021-01-07 NOTE — Patient Instructions (Addendum)
Try uberlube for lubrication.   Atrophic Vaginitis  Atrophic vaginitis is a condition in which the tissues that line the vagina become dry and thin. This condition is most common in women who have stopped having regular menstrual periods (are in menopause). This usually starts when a woman is 3445 to 51 years old. That is the timewhen a woman's estrogen levels begin to decrease. Estrogen is a female hormone. It helps to keep the tissues of the vagina moist. It stimulates the vagina to produce a clear fluid that lubricates the vagina for sex. This fluid also protects the vagina from infection. Lack of estrogen can cause the lining of the vagina to get thinner and dryer. The vagina may also shrink in size. It may become less elastic. Atrophic vaginitis tends toget worse over time as a woman's estrogen level drops. What are the causes? This condition is caused by the normal drop in estrogen that happens around thetime of menopause. What increases the risk? Certain conditions or situations may lower a woman's estrogen level, leading to a higher risk for atrophic vaginitis. You are more likely to develop this condition if: You are taking medicines that block estrogen. You have had your ovaries removed. You are being treated for cancer with radiation or medicines (chemotherapy). You have given birth or are breastfeeding. You are older than age 51. You smoke. What are the signs or symptoms? Symptoms of this condition include: Pain, soreness, a feeling of pressure, or bleeding during sex (dyspareunia). Vaginal burning, irritation, or itching. Pain or bleeding when a speculum is used in a vaginal exam. Having burning pain while urinating. Vaginal discharge. In some cases, there are no symptoms. How is this diagnosed? This condition is diagnosed based on your medical history and a physical exam. This will include a pelvic exam that checks the vaginal tissues. Though rare, you may also have other tests,  including: A urine test. A test that checks the acid balance in your vagina (acid balance test). How is this treated? Treatment for this condition depends on how severe your symptoms are. Treatment may include: Using an over-the-counter vaginal lubricant before sex. Using a long-acting vaginal moisturizer. Using low-dose estrogen for moderate to severe symptoms that do not respond to other treatments. Options include creams, tablets, and inserts (vaginal rings). Before you use a vaginal estrogen, tell your health care provider if you have a history of: Breast cancer. Endometrial cancer. Blood clots. If you are not sexually active and your symptoms are very mild, you may notneed treatment. Follow these instructions at home: Medicines Take over-the-counter and prescription medicines only as told by your health care provider. Do not use herbal or alternative medicines unless your health care provider says that you can. Use over-the-counter creams, lubricants, or moisturizers for dryness only as told by your health care provider. General instructions If your atrophic vaginitis is caused by menopause, discuss all of your menopause symptoms and treatment options with your health care provider. Do not douche. Do not use products that can make your vagina dry. These include: Scented feminine sprays. Scented tampons. Scented soaps. Vaginal sex can help to improve blood flow and elasticity of vaginal tissue. If you choose to have sex and it hurts, try using a water-soluble lubricant or moisturizer right before having sex. Contact a health care provider if: Your discharge looks different than normal. Your vagina has an unusual smell. You have new symptoms. Your symptoms do not improve with treatment. Your symptoms get worse. Summary Atrophic vaginitis is  a condition in which the tissues that line the vagina become dry and thin. It is most common in women who have stopped having regular menstrual  periods (are in menopause). Treatment options include using vaginal lubricants and low-dose vaginal estrogen. Contact a health care provider if your vagina has an unusual smell, or if your symptoms get worse or do not improve after treatment. This information is not intended to replace advice given to you by your health care provider. Make sure you discuss any questions you have with your healthcare provider. Document Revised: 11/27/2019 Document Reviewed: 11/27/2019 Elsevier Patient Education  2022 [employer] Inc.  EXERCISE   We recommended that you start or continue a regular exercise program for good health. Physical activity is anything that gets your body moving, some is better than none. The CDC recommends 150 minutes per week of Moderate-Intensity Aerobic Activity and 2 or more days of Muscle Strengthening Activity.  Benefits of exercise are limitless: helps weight loss/weight maintenance, improves mood and energy, helps with depression and anxiety, improves sleep, tones and strengthens muscles, improves balance, improves bone density, protects from chronic conditions such as heart disease, high blood pressure and diabetes and so much more. To learn more visit: http://kirby-bean.org/  DIET: Good nutrition starts with a healthy diet of fruits, vegetables, whole grains, and lean protein sources. Drink plenty of water for hydration. Minimize empty calories, sodium, sweets. For more information about dietary recommendations visit: CriticalGas.be and https://www.carpenter-henry.info/  ALCOHOL:  Women should limit their alcohol intake to no more than 7 drinks/beers/glasses of wine (combined, not each!) per week. Moderation of alcohol intake to this level decreases your risk of breast cancer and liver damage.  If you are concerned that you may have a problem, or your friends have told you they are concerned about your drinking,  there are many resources to help. A well-known program that is free, effective, and available to all people all over the nation is Alcoholics Anonymous.  Check out this site to learn more: BeverageBargains.co.za   CALCIUM AND VITAMIN D:  Adequate intake of calcium and Vitamin D are recommended for bone health.  You should be getting between 1000-1200 mg of calcium and 800 units of Vitamin D daily between diet and supplements  PAP SMEARS:  Pap smears, to check for cervical cancer or precancers,  have traditionally been done yearly, scientific advances have shown that most women can have pap smears less often.  However, every woman still should have a physical exam from her gynecologist every year. It will include a breast check, inspection of the vulva and vagina to check for abnormal growths or skin changes, a visual exam of the cervix, and then an exam to evaluate the size and shape of the uterus and ovaries. We will also provide age appropriate advice regarding health maintenance, like when you should have certain vaccines, screening for sexually transmitted diseases, bone density testing, colonoscopy, mammograms, etc.   MAMMOGRAMS:  All women over 31 years old should have a routine mammogram.   COLON CANCER SCREENING: Now recommend starting at age 14. At this time colonoscopy is not covered for routine screening until 50. There are take home tests that can be done between 45-49.   COLONOSCOPY:  Colonoscopy to screen for colon cancer is recommended for all women at age 37.  We know, you hate the idea of the prep.  We agree, BUT, having colon cancer and not knowing it is worse!!  Colon cancer so often starts as a polyp  that can be seen and removed at colonscopy, which can quite literally save your life!  And if your first colonoscopy is normal and you have no family history of colon cancer, most women don't have to have it again for 10 years.  Once every ten years, you can do something that may end up  saving your life, right?  We will be happy to help you get it scheduled when you are ready.  Be sure to check your insurance coverage so you understand how much it will cost.  It may be covered as a preventative service at no cost, but you should check your particular policy.      Breast Self-Awareness Breast self-awareness means being familiar with how your breasts look and feel. It involves checking your breasts regularly and reporting any changes to your health care provider. Practicing breast self-awareness is important. A change in your breasts can be a sign of a serious medical problem. Being familiar with how your breasts look and feel allows you to find any problems early, when treatment is more likely to be successful. All women should practice breast self-awareness, including women who have had breast implants. How to do a breast self-exam One way to learn what is normal for your breasts and whether your breasts are changing is to do a breast self-exam. To do a breast self-exam: Look for Changes  Remove all the clothing above your waist. Stand in front of a mirror in a room with good lighting. Put your hands on your hips. Push your hands firmly downward. Compare your breasts in the mirror. Look for differences between them (asymmetry), such as: Differences in shape. Differences in size. Puckers, dips, and bumps in one breast and not the other. Look at each breast for changes in your skin, such as: Redness. Scaly areas. Look for changes in your nipples, such as: Discharge. Bleeding. Dimpling. Redness. A change in position. Feel for Changes Carefully feel your breasts for lumps and changes. It is best to do this while lying on your back on the floor and again while sitting or standing in the shower or tub with soapy water on your skin. Feel each breast in the following way: Place the arm on the side of the breast you are examining above your head. Feel your breast with the  other hand. Start in the nipple area and make  inch (2 cm) overlapping circles to feel your breast. Use the pads of your three middle fingers to do this. Apply light pressure, then medium pressure, then firm pressure. The light pressure will allow you to feel the tissue closest to the skin. The medium pressure will allow you to feel the tissue that is a little deeper. The firm pressure will allow you to feel the tissue close to the ribs. Continue the overlapping circles, moving downward over the breast until you feel your ribs below your breast. Move one finger-width toward the center of the body. Continue to use the  inch (2 cm) overlapping circles to feel your breast as you move slowly up toward your collarbone. Continue the up and down exam using all three pressures until you reach your armpit.  Write Down What You Find  Write down what is normal for each breast and any changes that you find. Keep a written record with breast changes or normal findings for each breast. By writing this information down, you do not need to depend only on memory for size, tenderness, or location. Write down where  you are in your menstrual cycle, if you are still menstruating. If you are having trouble noticing differences in your breasts, do not get discouraged. With time you will become more familiar with the variations in your breasts and more comfortable with the exam. How often should I examine my breasts? Examine your breasts every month. If you are breastfeeding, the best time to examine your breasts is after a feeding or after using a breast pump. If you menstruate, the best time to examine your breasts is 5-7 days after your period is over. During your period, your breasts are lumpier, and it may be more difficult to notice changes. When should I see my health care provider? See your health care provider if you notice: A change in shape or size of your breasts or nipples. A change in the skin of your breast  or nipples, such as a reddened or scaly area. Unusual discharge from your nipples. A lump or thick area that was not there before. Pain in your breasts. Anything that concerns you. Urinary Incontinence  Urinary incontinence refers to a condition in which a person is unable to control where and when to pass urine. A person with this condition will urinate when he or she does not mean to (involuntarily). What are the causes? This condition may be caused by: Medicines. Infections. Constipation. Overactive bladder muscles. Weak bladder muscles. Weak pelvic floor muscles. These muscles provide support for the bladder, intestine, and, in women, the uterus. Enlarged prostate in men. The prostate is a gland near the bladder. When it gets too big, it can pinch the urethra. With the urethra blocked, the bladder can weaken and lose the ability to empty properly. Surgery. Emotional factors, such as anxiety, stress, or post-traumatic stress disorder (PTSD). Pelvic organ prolapse. This happens in women when organs shift out of place and into the vagina. This shift can prevent the bladder and urethra from working properly. What increases the risk? The following factors may make you more likely to develop this condition: Older age. Obesity and physical inactivity. Pregnancy and childbirth. Menopause. Diseases that affect the nerves or spinal cord (neurological diseases). Long-term (chronic) coughing. This can increase pressure on the bladder and pelvic floor muscles. What are the signs or symptoms? Symptoms may vary depending on the type of urinary incontinence you have. They include: A sudden urge to urinate, but passing urine involuntarily before you can get to a bathroom (urge incontinence). Suddenly passing urine with any activity that forces urine to pass, such as coughing, laughing, exercise, or sneezing (stress incontinence). Needing to urinate often, but urinating only a small amount, or  constantly dribbling urine (overflow incontinence). Urinating because you cannot get to the bathroom in time due to a physical disability, such as arthritis or injury, or communication and thinking problems, such as Alzheimer disease (functional incontinence). How is this diagnosed? This condition may be diagnosed based on: Your medical history. A physical exam. Tests, such as: Urine tests. X-rays of your kidney and bladder. Ultrasound. CT scan. Cystoscopy. In this procedure, a health care provider inserts a tube with a light and camera (cystoscope) through the urethra and into the bladder in order to check for problems. Urodynamic testing. These tests assess how well the bladder, urethra, and sphincter can store and release urine. There are different types of urodynamic tests, and they vary depending on what the test is measuring. To help diagnose your condition, your health care provider may recommend thatyou keep a log of when you  urinate and how much you urinate. How is this treated? Treatment for this condition depends on the type of incontinence that you have and its cause. Treatment may include: Lifestyle changes, such as: Quitting smoking. Maintaining a healthy weight. Staying active. Try to get 150 minutes of moderate-intensity exercise every week. Ask your health care provider which activities are safe for you. Eating a healthy diet. Avoid high-fat foods, like fried foods. Avoid refined carbohydrates like white bread and white rice. Limit how much alcohol and caffeine you drink. Increase your fiber intake. Foods such as fresh fruits, vegetables, beans, and whole grains are healthy sources of fiber. Pelvic floor muscle exercises. Bladder training, such as lengthening the amount of time between bathroom breaks, or using the bathroom at regular intervals. Using techniques to suppress bladder urges. This can include distraction techniques or controlled breathing exercises. Medicines  to relax the bladder muscles and prevent bladder spasms. Medicines to help slow or prevent the growth of a man's prostate. Botox injections. These can help relax the bladder muscles. Using pulses of electricity to help change bladder reflexes (electrical nerve stimulation). For women, using a medical device to prevent urine leaks. This is a small, tampon-like, disposable device that is inserted into the urethra. Injecting collagen or carbon beads (bulking agents) into the urinary sphincter. These can help thicken tissue and close the bladder opening. Surgery. Follow these instructions at home: Lifestyle Limit alcohol and caffeine. These can fill your bladder quickly and irritate it. Keep yourself clean to help prevent odors and skin damage. Ask your doctor about special skin creams and cleansers that can protect the skin from urine. Consider wearing pads or adult diapers. Make sure to change them regularly, and always change them right after experiencing incontinence. General instructions Take over-the-counter and prescription medicines only as told by your health care provider. Use the bathroom about every 3-4 hours, even if you do not feel the need to urinate. Try to empty your bladder completely every time. After urinating, wait a minute. Then try to urinate again. Make sure you are in a relaxed position while urinating. If your incontinence is caused by nerve problems, keep a log of the medicines you take and the times you go to the bathroom. Keep all follow-up visits as told by your health care provider. This is important. Contact a health care provider if: You have pain that gets worse. Your incontinence gets worse. Get help right away if: You have a fever or chills. You are unable to urinate. You have redness in your groin area or down your legs. Summary Urinary incontinence refers to a condition in which a person is unable to control where and when to pass urine. This condition may  be caused by medicines, infection, weak bladder muscles, weak pelvic floor muscles, enlargement of the prostate (in men), or surgery. The following factors increase your risk for developing this condition: older age, obesity, pregnancy and childbirth, menopause, neurological diseases, and chronic coughing. There are several types of urinary incontinence. They include urge incontinence, stress incontinence, overflow incontinence, and functional incontinence. This condition is usually treated first with lifestyle and behavioral changes, such as quitting smoking, eating a healthier diet, and doing regular pelvic floor exercises. Other treatment options include medicines, bulking agents, medical devices, electrical nerve stimulation, or surgery. This information is not intended to replace advice given to you by your health care provider. Make sure you discuss any questions you have with your healthcare provider. Document Revised: 06/08/2017 Document Reviewed: 09/07/2016 Elsevier  Patient Education  2021 Else[employer] Inc. Kegel Exercises  Kegel exercises can help strengthen your pelvic floor muscles. The pelvic floor is a group of muscles that support your rectum, small intestine, and bladder. In females, pelvic floor muscles also help support the womb (uterus). These muscles help you control the flow of urine and stool. Kegel exercises are painless and simple, and they do not require any equipment. Your provider may suggest Kegel exercises to: Improve bladder and bowel control. Improve sexual response. Improve weak pelvic floor muscles after surgery to remove the uterus (hysterectomy) or pregnancy (females). Improve weak pelvic floor muscles after prostate gland removal or surgery (males). Kegel exercises involve squeezing your pelvic floor muscles, which are the same muscles you squeeze when you try to stop the flow of urine or keep from passing gas. The exercises can be done while sitting, standing, or  lying down, but itis best to vary your position. Exercises How to do Kegel exercises: Squeeze your pelvic floor muscles tight. You should feel a tight lift in your rectal area. If you are a female, you should also feel a tightness in your vaginal area. Keep your stomach, buttocks, and legs relaxed. Hold the muscles tight for up to 10 seconds. Breathe normally. Relax your muscles. Repeat as told by your health care provider. Repeat this exercise daily as told by your health care provider. Continue to do this exercise for at least 4-6 weeks, or for as long as told by your healthcare provider. You may be referred to a physical therapist who can help you learn more abouthow to do Kegel exercises. Depending on your condition, your health care provider may recommend: Varying how long you squeeze your muscles. Doing several sets of exercises every day. Doing exercises for several weeks. Making Kegel exercises a part of your regular exercise routine. This information is not intended to replace advice given to you by your health care provider. Make sure you discuss any questions you have with your healthcare provider. Document Revised: 05/19/2020 Document Reviewed: 01/16/2018 Elsevier Patient Education  2022 [employer].

## 2021-04-14 ENCOUNTER — Other Ambulatory Visit: Payer: Self-pay | Admitting: Family Medicine

## 2021-04-14 DIAGNOSIS — Z1231 Encounter for screening mammogram for malignant neoplasm of breast: Secondary | ICD-10-CM

## 2021-05-18 ENCOUNTER — Ambulatory Visit
Admission: RE | Admit: 2021-05-18 | Discharge: 2021-05-18 | Disposition: A | Discharge status: Home / Self Care | Payer: Self-pay | Source: Ambulatory Visit | Attending: Family Medicine | Admitting: Family Medicine

## 2021-05-18 DIAGNOSIS — Z1231 Encounter for screening mammogram for malignant neoplasm of breast: Secondary | ICD-10-CM

## 2021-07-02 IMAGING — MG DIGITAL SCREENING BILATERAL MAMMOGRAM WITH TOMO AND CAD
8 series · 8 of 24 positions shown · non-contrast
Comparison: Previous exam(s).

CLINICAL DATA: Screening.

EXAM:
DIGITAL SCREENING BILATERAL MAMMOGRAM WITH TOMO AND CAD

[R CC synth-2D]
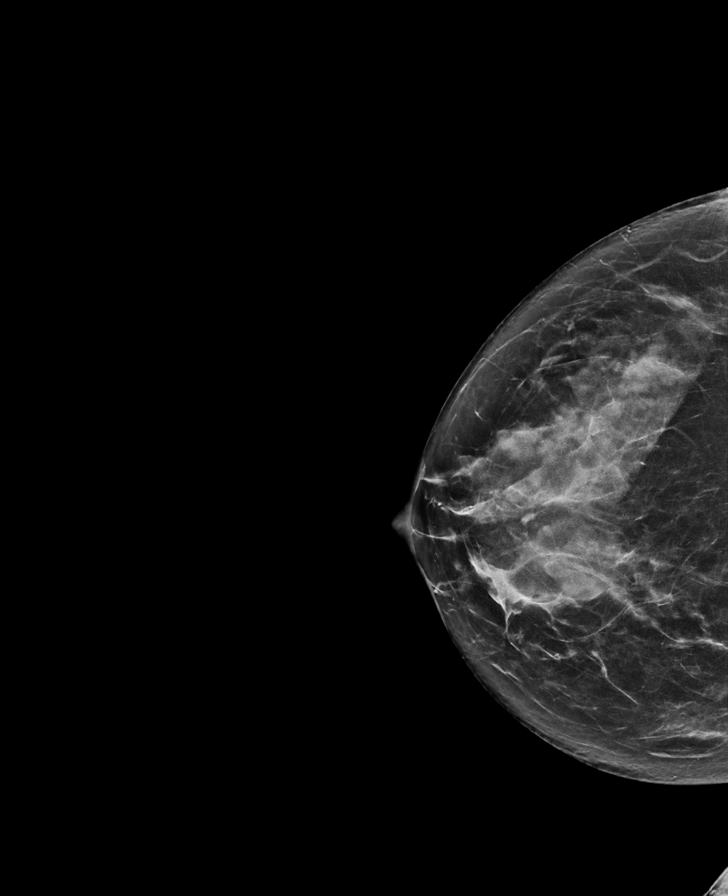

[L MLO synth-2D]
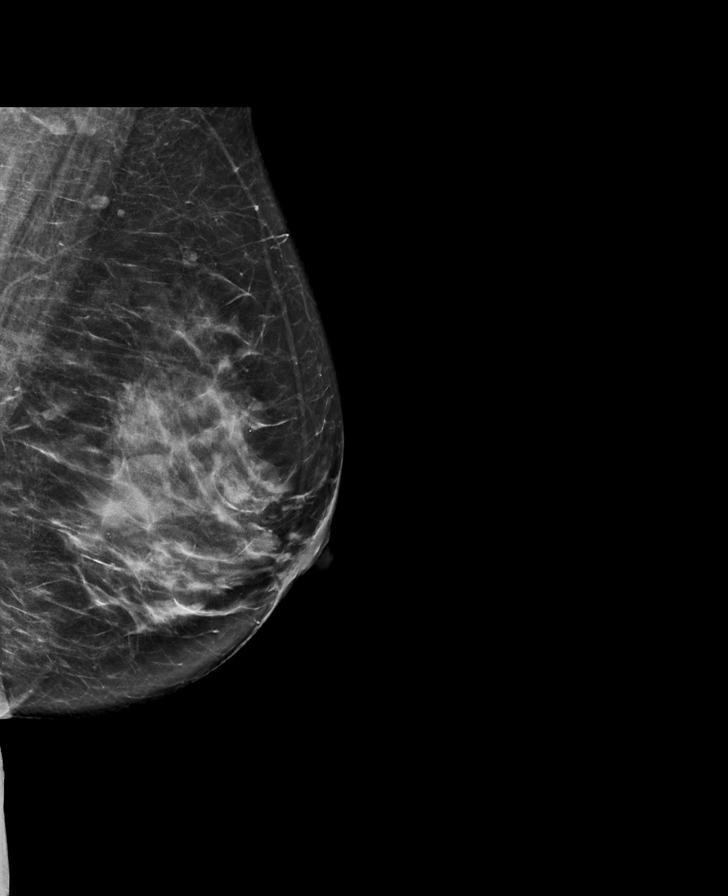

[L CC synth-2D]
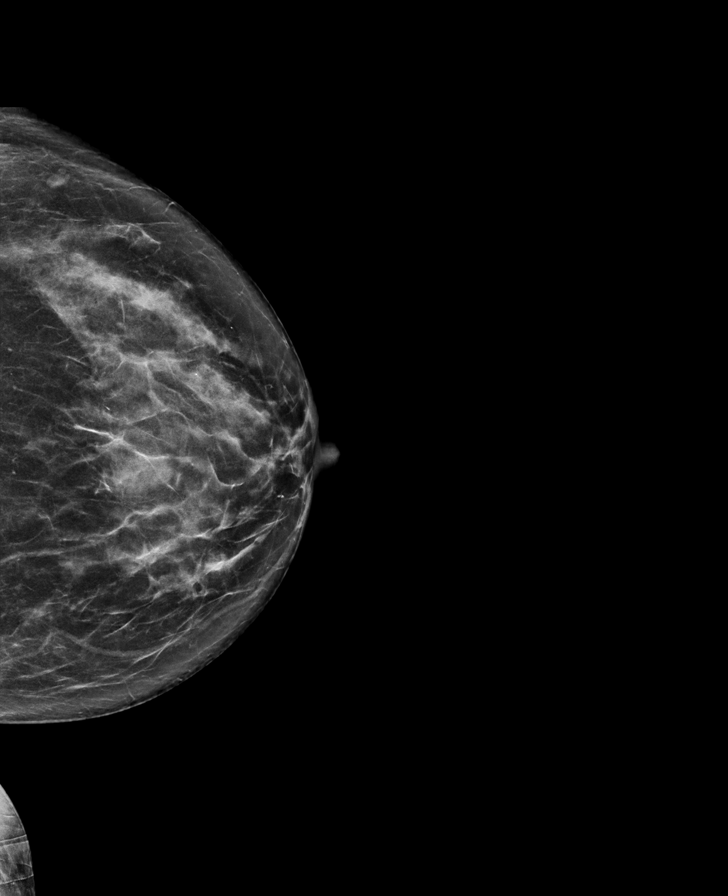

[R MLO synth-2D]
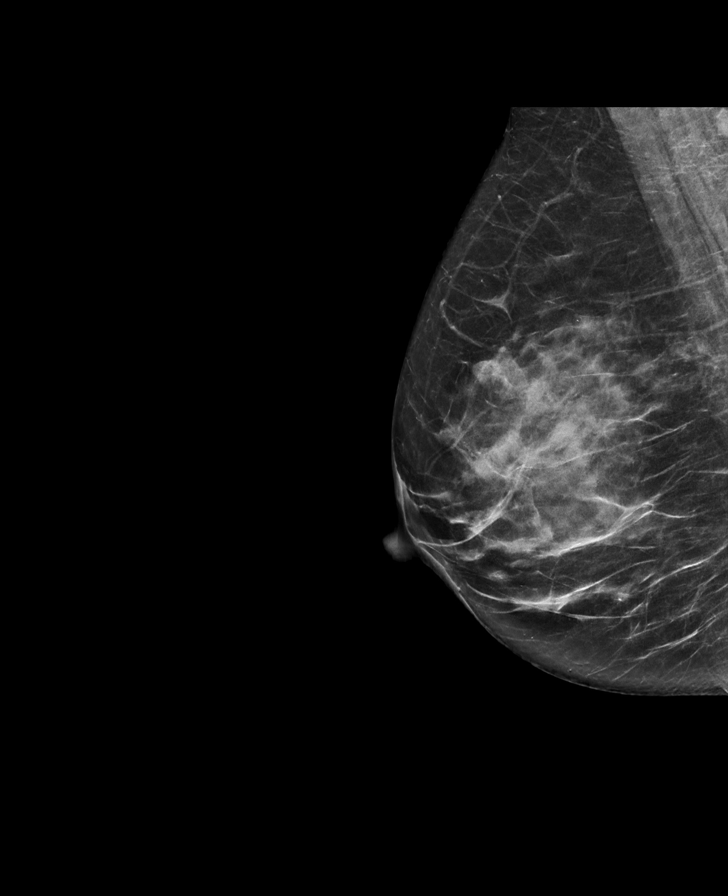

[R CC tomo · tomo slice 34/67.0]
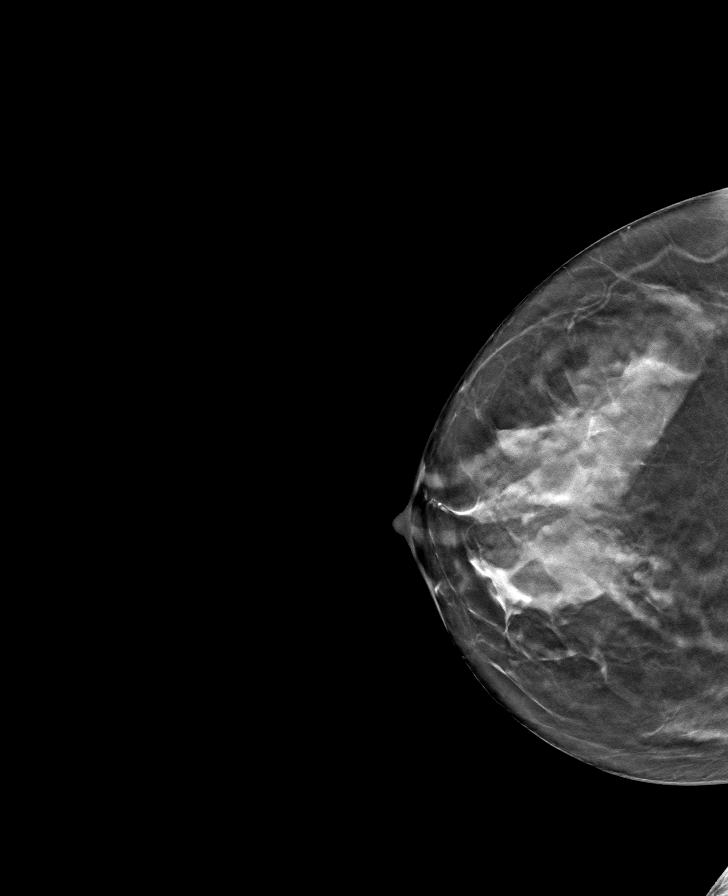

[L CC tomo · tomo slice 34/67.0]
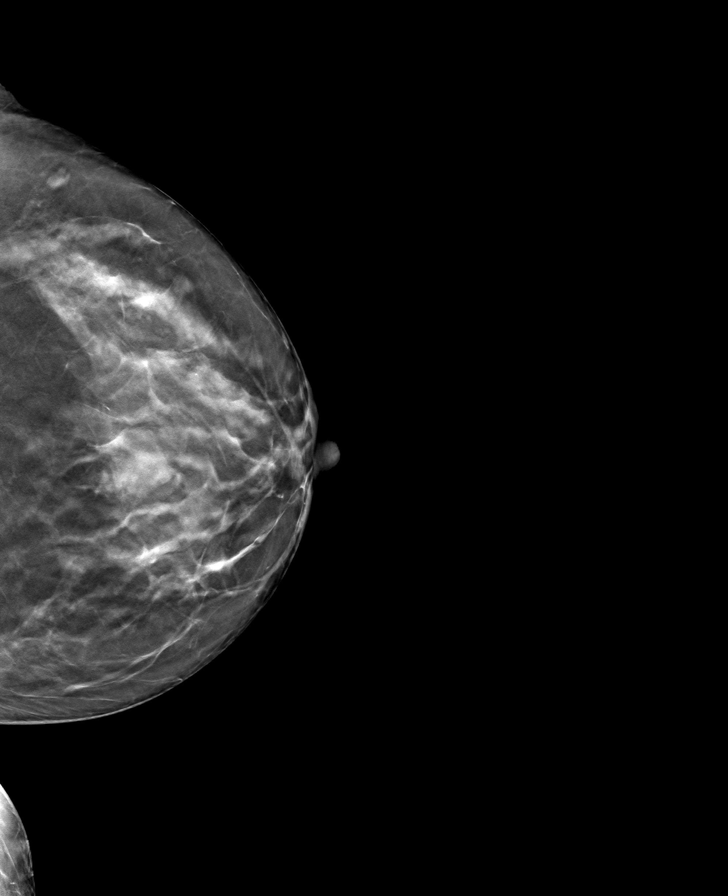

[R MLO tomo · tomo slice 33/65.0]
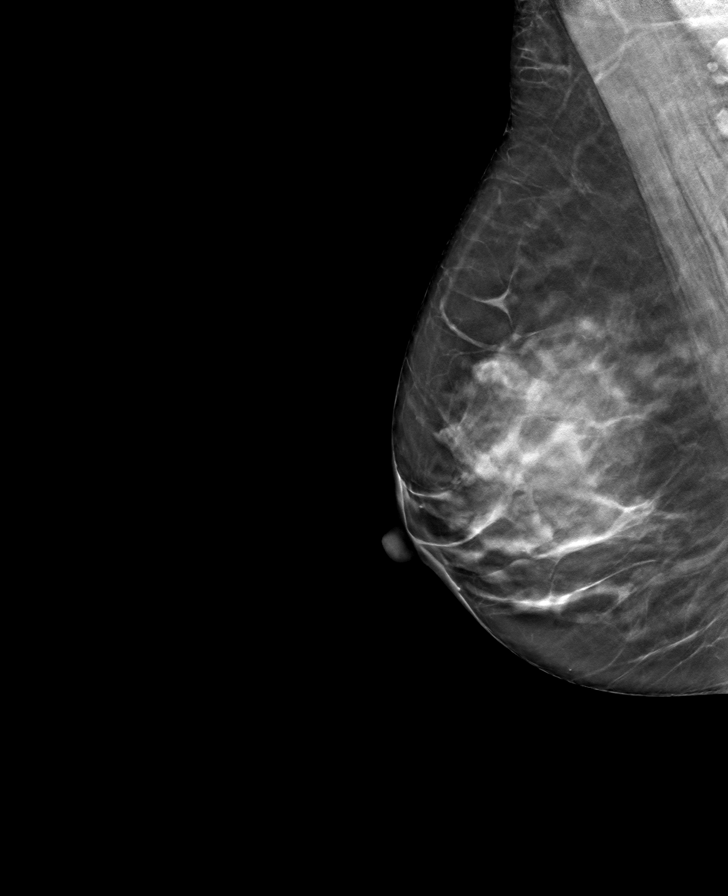

[L MLO tomo · tomo slice 36/71.0]
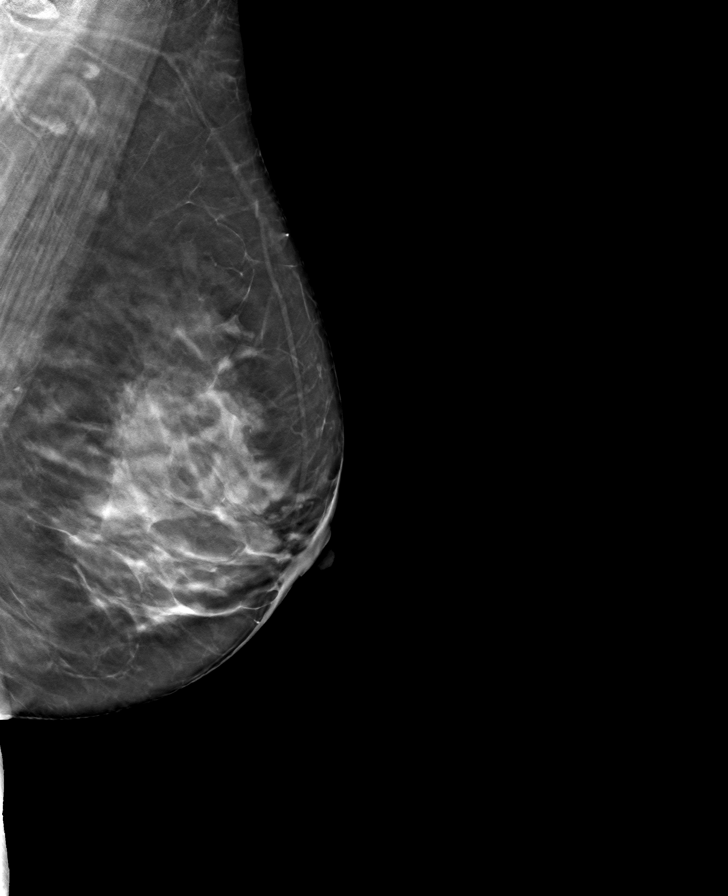

[8 of 24 positions shown; findings below may reference images not displayed]

ACR Breast Density Category c: The breast tissue is heterogeneously
dense, which may obscure small masses.
FINDINGS: There are no findings suspicious for malignancy. Images were
processed with CAD.
IMPRESSION: No mammographic evidence of malignancy. A result letter of this
screening mammogram will be mailed directly to the patient.

RECOMMENDATION:
Screening mammogram in one year. (Code:[id number]-[id number])

BI-RADS CATEGORY  1: Negative.

## 2022-11-26 ENCOUNTER — Emergency Department (HOSPITAL_BASED_OUTPATIENT_CLINIC_OR_DEPARTMENT_OTHER)
Admission: EM | Admit: 2022-11-26 | Discharge: 2022-11-26 | Disposition: A | Discharge status: Home / Self Care | Payer: BC Managed Care – PPO | Attending: Emergency Medicine | Admitting: Emergency Medicine

## 2022-11-26 ENCOUNTER — Emergency Department (HOSPITAL_BASED_OUTPATIENT_CLINIC_OR_DEPARTMENT_OTHER): Payer: BC Managed Care – PPO | Admitting: Radiology

## 2022-11-26 ENCOUNTER — Other Ambulatory Visit: Payer: Self-pay

## 2022-11-26 ENCOUNTER — Encounter (HOSPITAL_BASED_OUTPATIENT_CLINIC_OR_DEPARTMENT_OTHER): Payer: Self-pay

## 2022-11-26 DIAGNOSIS — X501XXA Overexertion from prolonged static or awkward postures, initial encounter: Secondary | ICD-10-CM | POA: Insufficient documentation

## 2022-11-26 DIAGNOSIS — Y9301 Activity, walking, marching and hiking: Secondary | ICD-10-CM | POA: Diagnosis not present

## 2022-11-26 DIAGNOSIS — S93401A Sprain of unspecified ligament of right ankle, initial encounter: Secondary | ICD-10-CM | POA: Diagnosis not present

## 2022-11-26 DIAGNOSIS — S99911A Unspecified injury of right ankle, initial encounter: Secondary | ICD-10-CM | POA: Diagnosis present

## 2022-11-26 NOTE — ED Provider Notes (Signed)
[place] EMERGENCY DEPARTMENT AT [facility] Provider Note   CSN: [id number] Arrival date & time: 11/26/22  1534     History Chief Complaint  Patient presents with   Ankle Pain    Katie Rowland is a 52 y.o. female.  Patient presents to the emergency department complaints of ankle pain.  She reports that she was walking down some steps today when she rolled her ankle and felt a pop in the area.  Unable to bear weight in the area.  Reports the pain is primarily in the lateral and medial malleoli.  Denies loss of sensation or function.  Has not taken any medications at home for pain.   Ankle Pain      Home Medications Prior to Admission medications   Medication Sig Start Date End Date Taking? Authorizing Provider  albuterol (VENTOLIN HFA) 108 (90 Base) MCG/ACT inhaler Inhale 2 puffs into the lungs every 6 (six) hours as needed for wheezing or shortness of breath.    [provider]  Azelastine HCl 0.15 % SOLN Place 1 spray into the nose daily. 03/14/13   [provider]  busPIRone (BUSPAR) 10 MG tablet Take 1 tablet by mouth daily.    [provider]  cetirizine (ZYRTEC) 10 MG tablet Take 10 mg by mouth daily.    [provider]  clonazePAM (KLONOPIN) 0.5 MG tablet Take 1 tablet by mouth 2 (two) times daily as needed. 11/20/14   [provider]  EPINEPHrine 0.3 mg/0.3 mL IJ SOAJ injection Inject into the muscle as directed. 03/15/15   [provider]  HYDROcodone-acetaminophen (NORCO/VICODIN) 5-325 MG tablet  10/06/19   [provider]  medroxyPROGESTERone (PROVERA) 5 MG tablet Take one tablet a day for 5 days every other month if no spontaneous cycle. 12/17/19   Romualdo Bolk, MD  meloxicam (MOBIC) 15 MG tablet Take 15 mg by mouth daily. 11/04/19   [provider]  montelukast (SINGULAIR) 10 MG tablet Take 1 tablet by mouth daily. 04/05/13   [provider]  NASONEX 50 MCG/ACT nasal spray  Place 2 sprays into the nose daily. 04/05/13   [provider]  sertraline (ZOLOFT) 100 MG tablet TK 1 T PO ONCE D 08/07/15   [provider]  [facility] 160-4.5 MCG/ACT inhaler  08/19/16   [provider]      Allergies    Shellfish allergy, Codeine, and Penicillins    Review of Systems   Review of Systems  Musculoskeletal:  Positive for joint swelling.  All other systems reviewed and are negative.   Physical Exam Updated Vital Signs BP (!) 140/97 (BP Location: Right Arm)   Pulse 85   Temp 98.4 F (36.9 C) (Oral)   Resp 18   Ht 5\' 5"  (1.651 m)   Wt 61.2 kg   LMP 06/13/2019 (Approximate)   SpO2 97%   BMI 22.47 kg/m  Physical Exam Vitals and nursing note reviewed.  Constitutional:      General: She is not in acute distress.    Appearance: She is well-developed.  HENT:     Head: Normocephalic and atraumatic.  Eyes:     Conjunctiva/sclera: Conjunctivae normal.  Cardiovascular:     Rate and Rhythm: Normal rate and regular rhythm.     Heart sounds: No murmur heard. Pulmonary:     Effort: Pulmonary effort is normal. No respiratory distress.     Breath sounds: Normal breath sounds.  Abdominal:     Palpations: Abdomen is soft.  Tenderness: There is no abdominal tenderness.  Musculoskeletal:        General: Swelling, tenderness and signs of injury present. No deformity. Normal range of motion.     Cervical back: Neck supple.     Comments: Tenderness to palpation along the lateral medial aspects of the malleoli.  No obvious bony deformity.  Range of motion due to pain.  Neurovascularly intact.  Skin:    General: Skin is warm and dry.     Capillary Refill: Capillary refill takes less than 2 seconds.  Neurological:     Mental Status: She is alert.  Psychiatric:        Mood and Affect: Mood normal.     ED Results / Procedures / Treatments   Labs (all labs ordered are listed, but only abnormal results are displayed) Labs Reviewed - No data to  display  EKG None  Radiology DG Ankle Complete Right  Result Date: 11/26/2022 CLINICAL DATA:  Twisted ankle today after stepping on toy. Right ankle pain and swelling. EXAM: RIGHT ANKLE - COMPLETE 3+ VIEW COMPARISON:  None Available. FINDINGS: There is no evidence of fracture, dislocation, or joint effusion. There is no evidence of arthropathy or other focal bone abnormality. Soft tissues are unremarkable. IMPRESSION: Negative. Electronically Signed   By: Danae Orleans M.D.   On: 11/26/2022 16:24    Procedures Procedures   Medications Ordered in ED Medications - No data to display  ED Course/ Medical Decision Making/ A&P                           Medical Decision Making Amount and/or Complexity of Data Reviewed Radiology: ordered.   This patient presents to the ED for concern of right ankle pain.  Differential diagnosis includes ankle sprain, ankle fracture, ankle dislocation, Achilles tendon rupture   Imaging Studies ordered:  I ordered imaging studies including right ankle x-ray I independently visualized and interpreted imaging which showed no obvious fracture or dislocation I agree with the radiologist interpretation   Problem List / ED Course:  Patient presents to the emergency department complaints of ankle pain.  She reports that she twisted her ankle on a child's toy and rolled the ankle outwards.  She reports that she felt a pop and pain on the lateral side of the right ankle.  Difficulty walking and bearing weight after this.  Will evaluate with x-ray imaging for possible ankle fracture given notable tenderness to palpation of the lateral and medial malleoli. X-ray imaging negative for any fractures.  Symptoms are highly consistent with an ankle sprain.  Will place patient into ASO lace up brace.  Advised patient to follow-up with primary care provider for further evaluation or return to the emergency department she has any acute worsening of symptoms.  Also encourage  patient manage symptoms at home with over-the-counter pain medication such as Tylenol, ibuprofen, Aleve.  Patient is agreeable treatment plan verbalized understanding all return precautions.  All questions answered prior to patient discharge.  Final Clinical Impression(s) / ED Diagnoses Final diagnoses:  Sprain of right ankle, unspecified ligament, initial encounter    Rx / DC Orders ED Discharge Orders     None         Salomon Mast 11/26/22 1812    Tegeler, Canary Brim, MD 11/26/22 2213

## 2022-11-26 NOTE — Discharge Instructions (Signed)
You were seen in the emergency department for ankle pain.  Your injury is consistent with an ankle sprain given that your x-rays negative for any fractures or dislocations.  Please take Tylenol, ibuprofen, Aleve for pain.  If you have any worsening or symptoms, return to the emergency department.  Otherwise you should follow-up with your primary care provider for further evaluation.

## 2022-11-26 NOTE — ED Triage Notes (Signed)
States stepped on toy and twisted ankle outwards.  States heard pop  pain to side of foot and ankle

## 2024-02-05 ENCOUNTER — Other Ambulatory Visit: Payer: Self-pay

## 2024-02-05 ENCOUNTER — Emergency Department (HOSPITAL_BASED_OUTPATIENT_CLINIC_OR_DEPARTMENT_OTHER)
Admission: EM | Admit: 2024-02-05 | Discharge: 2024-02-05 | Disposition: A | Discharge status: Home / Self Care | Attending: Emergency Medicine | Admitting: Emergency Medicine

## 2024-02-05 DIAGNOSIS — L509 Urticaria, unspecified: Secondary | ICD-10-CM | POA: Insufficient documentation

## 2024-02-05 LAB — BASIC METABOLIC PANEL WITH GFR
Anion gap: 10 (ref 5–15)
BUN: 8 mg/dL (ref 6–20)
CO2: 24 mmol/L (ref 22–32)
Calcium: 9.4 mg/dL (ref 8.9–10.3)
Chloride: 101 mmol/L (ref 98–111)
Creatinine, Ser: 0.55 mg/dL (ref 0.44–1.00)
GFR, Estimated: >60 mL/min (ref >60)
Glucose, Bld: 91 mg/dL (ref 70–99)
Potassium: 3.5 mmol/L (ref 3.5–5.1)
Sodium: 135 mmol/L (ref 135–145)

## 2024-02-05 LAB — CBC WITH DIFFERENTIAL/PLATELET
Abs Immature Granulocytes: 0.02 K/uL (ref 0.00–0.07)
Basophils Absolute: 0 K/uL (ref 0.0–0.1)
Basophils Relative: 0 %
Eosinophils Absolute: 0.1 K/uL (ref 0.0–0.5)
Eosinophils Relative: 1 %
HCT: 41.8 % (ref 36.0–46.0)
Hemoglobin: 13.8 g/dL (ref 12.0–15.0)
Immature Granulocytes: 0 %
Lymphocytes Relative: 32 %
Lymphs Abs: 1.9 K/uL (ref 0.7–4.0)
MCH: 29.1 pg (ref 26.0–34.0)
MCHC: 33 g/dL (ref 30.0–36.0)
MCV: 88.2 fL (ref 80.0–100.0)
Monocytes Absolute: 0.5 K/uL (ref 0.1–1.0)
Monocytes Relative: 8 %
Neutro Abs: 3.5 K/uL (ref 1.7–7.7)
Neutrophils Relative %: 59 %
Platelets: 256 K/uL (ref 150–400)
RBC: 4.74 MIL/uL (ref 3.87–5.11)
RDW: 13.2 % (ref 11.5–15.5)
WBC: 5.9 K/uL (ref 4.0–10.5)
nRBC: 0 % (ref 0.0–0.2)

## 2024-02-05 MED ORDER — PREDNISONE 20 MG PO TABS: 40.0000 mg | ORAL_TABLET | Freq: Every day | ORAL | 0 refills | Status: AC

## 2024-02-05 MED ORDER — FAMOTIDINE 20 MG PO TABS
20.0000 mg | ORAL_TABLET | Freq: Once | ORAL | Status: AC
  Administered 2024-02-05: 20 mg via ORAL
  Filled 2024-02-05: qty 1

## 2024-02-05 MED ORDER — METHYLPREDNISOLONE SODIUM SUCC 125 MG IJ SOLR
125.0000 mg | Freq: Once | INTRAMUSCULAR | Status: AC
  Administered 2024-02-05: 125 mg via INTRAVENOUS
  Filled 2024-02-05: qty 2

## 2024-02-05 MED ORDER — DIPHENHYDRAMINE HCL 50 MG/ML IJ SOLN
25.0000 mg | Freq: Once | INTRAMUSCULAR | Status: AC
  Administered 2024-02-05: 25 mg via INTRAVENOUS
  Filled 2024-02-05: qty 1

## 2024-02-05 NOTE — Discharge Instructions (Addendum)
 Please read and follow all provided instructions.  Your diagnoses today include:  1. Urticaria     Tests performed today include: Vital signs. See below for your results today.   Medications prescribed:  Prednisone  - steroid medicine   It is best to take this medication in the morning to prevent sleeping problems. If you are diabetic, monitor your blood sugar closely and stop taking Prednisone  if blood sugar is over 300. Take with food to prevent stomach upset.   Take any prescribed medications only as directed.  Home care instructions:  Follow any educational materials contained in this packet For antihistamines: You may take oral loratadine (Claritin) or oral cetirizine (Zyrtec) during the day as these are less sedating and oral diphenhydramine  (Benadryl ) at night. You may also take oral Pepcid  twice a day which is another type of histamine blocker.   Follow-up instructions: Please follow-up with your primary care provider or allergist as needed for further evaluation of your symptoms.   Return instructions:  Please return to the Emergency Department if you experience worsening symptoms.  Call 9-1-1 immediately if you have an allergic reaction that involves your lips, mouth, throat or if you have any difficulty breathing. This is a life-threatening emergency.  Please return if you have any other emergent concerns.  Additional Information:  Your vital signs today were: BP 123/84 (BP Location: Right Arm)   Pulse 73   Temp (!) 97 F (36.1 C) (Oral)   Resp 18   LMP 06/13/2019 (Approximate)   SpO2 99%  If your blood pressure (BP) was elevated above 135/85 this visit, please have this repeated by your doctor within one month. --------------

## 2024-02-05 NOTE — ED Provider Notes (Signed)
 [place] EMERGENCY DEPARTMENT AT [facility] Provider Note   CSN: [id number] Arrival date & time: 02/05/24  9182     Patient presents with: Allergic Reaction   Katie Rowland is a 53 y.o. female.   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  Patient states that her reaction started yesterday.  She developed hives on her arms, legs, and torso to a lesser extent.  She was recently on a 10-day course of amoxicillin for sinusitis.  She completed this yesterday.  Symptoms started after her last dose.  She denies other skin exposures, new medications, foods which are suspicious for causing allergy.  No fevers.  She does feel some mild fullness in her throat but has not had any difficulty breathing, coughing or shortness of breath  She denies associated vomiting or diarrhea.  No lightheadedness or syncope.  Patient states that she went to her allergist office this morning, was referred to the emergency department.  No epinephrine prior to arrival.  Denies tick bites.  She has felt generally fatigued over the past day like I could just fall asleep.       Prior to Admission medications   Medication Sig Start Date End Date Taking? Authorizing Provider  albuterol (VENTOLIN HFA) 108 (90 Base) MCG/ACT inhaler Inhale 2 puffs into the lungs every 6 (six) hours as needed for wheezing or shortness of breath.    [provider]  Azelastine HCl 0.15 % SOLN Place 1 spray into the nose daily. 03/14/13   [provider]  busPIRone (BUSPAR) 10 MG tablet Take 1 tablet by mouth daily.    [provider]  cetirizine (ZYRTEC) 10 MG tablet Take 10 mg by mouth daily.    [provider]  clonazePAM (KLONOPIN) 0.5 MG tablet Take 1 tablet by mouth 2 (two) times daily as needed. 11/20/14   [provider]  EPINEPHrine 0.3 mg/0.3 mL IJ SOAJ injection Inject into the muscle  as directed. 03/15/15   [provider]  HYDROcodone-acetaminophen (NORCO/VICODIN) 5-325 MG tablet  10/06/19   [provider]  medroxyPROGESTERone  (PROVERA ) 5 MG tablet Take one tablet a day for 5 days every other month if no spontaneous cycle. 12/17/19   Jertson, Jill Evelyn, MD  meloxicam (MOBIC) 15 MG tablet Take 15 mg by mouth daily. 11/04/19   [provider]  montelukast (SINGULAIR) 10 MG tablet Take 1 tablet by mouth daily. 04/05/13   [provider]  NASONEX 50 MCG/ACT nasal spray Place 2 sprays into the nose daily. 04/05/13   [provider]  sertraline (ZOLOFT) 100 MG tablet TK 1 T PO ONCE D 08/07/15   [provider]  SYMBICORT 160-4.5 MCG/ACT inhaler  08/19/16   [provider]    Allergies: Shellfish allergy, Codeine, and Penicillins    Review of Systems  Updated Vital Signs BP 123/84 (BP Location: Right Arm)   Pulse 73   Temp (!) 97 F (36.1 C) (Oral)   Resp 18   LMP 06/13/2019 (Approximate)   SpO2 99%   Physical Exam Vitals and nursing note reviewed.  Constitutional:      Appearance: She is well-developed.  HENT:     Head: Normocephalic and atraumatic.     Right Ear: External ear normal.     Left Ear: External ear normal.     Nose: Nose normal. No congestion.     Mouth/Throat:     Mouth: Mucous  membranes are moist.     Comments: No oropharyngeal edema. Eyes:     Conjunctiva/sclera: Conjunctivae normal.  Cardiovascular:     Rate and Rhythm: Normal rate.  Pulmonary:     Effort: No respiratory distress.     Comments: Lungs clear to auscultation bilaterally, no wheezing. Abdominal:     Tenderness: There is no abdominal tenderness. There is no guarding or rebound.  Musculoskeletal:     Cervical back: Normal range of motion and neck supple.  Skin:    General: Skin is warm and dry.  Neurological:     Mental Status: She is alert.     (all labs ordered are listed, but only abnormal results are  displayed) Labs Reviewed  CBC WITH DIFFERENTIAL/PLATELET  BASIC METABOLIC PANEL WITH GFR    EKG: None  Radiology: No results found.   Procedures   Medications Ordered in the ED  methylPREDNISolone  sodium succinate (SOLU-MEDROL ) 125 mg/2 mL injection 125 mg (has no administration in time range)  diphenhydrAMINE  (BENADRYL ) injection 25 mg (has no administration in time range)  famotidine  (PEPCID ) tablet 20 mg (has no administration in time range)   ED Course  Patient seen and examined. History obtained directly from patient.   Labs/EKG: Ordered CBC, BMP.  Imaging: None ordered  Medications/Fluids: Ordered: IV Solu-Medrol /Benadryl , p.o. Pepcid .   Most recent vital signs reviewed and are as follows: BP 123/84 (BP Location: Right Arm)   Pulse 73   Temp (!) 97 F (36.1 C) (Oral)   Resp 18   LMP 06/13/2019 (Approximate)   SpO2 99%   Initial impression: Urticaria, likely hypersensitivity reaction, no evidence of anaphylaxis at this time.  Patient reports some fullness in her throat, however no respiratory distress at time of exam.  No obvious angioedema on exam.  10:19 AM Reassessment performed. Patient appears stable.  Urticaria still present.  She states that it feels like her throat tightness is improved.  Offered discharge versus continued observation, patient would like to go home.  Labs personally reviewed and interpreted including: CBC unremarkable; BMP unremarkable.  Reviewed pertinent lab work and imaging with patient at bedside. Questions answered.   Most current vital signs reviewed and are as follows: BP 123/84 (BP Location: Right Arm)   Pulse 73   Temp (!) 97 F (36.1 C) (Oral)   Resp 18   LMP 06/13/2019 (Approximate)   SpO2 99%   Plan: Discharge to home.  Discussed antihistamine use, patient is already on nonsedating antihistamine during the day daily.  Encouraged to continue this.  Discussed use of Benadryl  at night, additional Pepcid .  She also has an  EpiPen available due to her shellfish allergy.  Prescriptions written for: Prednisone   Other home care instructions discussed: Avoidance of triggers, monitoring of symptoms  ED return instructions discussed: Worsening symptoms including difficulty breathing or swallowing  Follow-up instructions discussed: Patient encouraged to follow-up with their PCP or allergist as needed for further evaluation.                                    Medical Decision Making Amount and/or Complexity of Data Reviewed Labs: ordered.  Risk Prescription drug management.   Patient presents today with urticaria, no hypotension, vomiting/diarrhea, angioedema to suggest anaphylaxis.  History is suggestive of hypersensitivity.  She does have a history of penicillin allergy as a child.  Recent amoxicillin exposure, however unusual that she took this for 10 days without  a reaction.  No other obvious causes.  Symptoms controlled in ED, encouraged follow-up.     Final diagnoses:  Urticaria    ED Discharge Orders          Ordered    predniSONE  (DELTASONE ) 20 MG tablet  Daily        02/05/24 1018               Desiderio Chew, PA-C 02/05/24 1020    Pamella Sharper A, DO 02/06/24 1637

## 2024-02-05 NOTE — ED Triage Notes (Signed)
 C/o hives on bilateral arms and chest starting this morning. Reports taking antibiotics for 10 days for sinus infection.   Also reports throat dont feel right but not shob
